# Patient Record
Sex: Male | Born: 1970 | Race: White | Hispanic: No | Marital: Married | Smoking: Never smoker
Health system: Southern US, Community
[De-identification: ages and names within clinical notes are randomized; demographics above are authoritative.]

## PROBLEM LIST (undated history)

## (undated) DIAGNOSIS — K589 Irritable bowel syndrome without diarrhea: Secondary | ICD-10-CM

## (undated) DIAGNOSIS — K2980 Duodenitis without bleeding: Secondary | ICD-10-CM

## (undated) DIAGNOSIS — K219 Gastro-esophageal reflux disease without esophagitis: Secondary | ICD-10-CM

## (undated) DIAGNOSIS — K5792 Diverticulitis of intestine, part unspecified, without perforation or abscess without bleeding: Secondary | ICD-10-CM

## (undated) DIAGNOSIS — K5790 Diverticulosis of intestine, part unspecified, without perforation or abscess without bleeding: Secondary | ICD-10-CM

## (undated) DIAGNOSIS — R7989 Other specified abnormal findings of blood chemistry: Secondary | ICD-10-CM

## (undated) DIAGNOSIS — K222 Esophageal obstruction: Secondary | ICD-10-CM

## (undated) DIAGNOSIS — K449 Diaphragmatic hernia without obstruction or gangrene: Secondary | ICD-10-CM

## (undated) DIAGNOSIS — K769 Liver disease, unspecified: Secondary | ICD-10-CM

## (undated) DIAGNOSIS — F419 Anxiety disorder, unspecified: Secondary | ICD-10-CM

## (undated) DIAGNOSIS — M5136 Other intervertebral disc degeneration, lumbar region: Secondary | ICD-10-CM

## (undated) DIAGNOSIS — F909 Attention-deficit hyperactivity disorder, unspecified type: Secondary | ICD-10-CM

## (undated) DIAGNOSIS — M51369 Other intervertebral disc degeneration, lumbar region without mention of lumbar back pain or lower extremity pain: Secondary | ICD-10-CM

## (undated) HISTORY — PX: HERNIA REPAIR: SHX51

## (undated) HISTORY — DX: Irritable bowel syndrome, unspecified: K58.9

## (undated) HISTORY — DX: Duodenitis without bleeding: K29.80

## (undated) HISTORY — DX: Diverticulosis of intestine, part unspecified, without perforation or abscess without bleeding: K57.90

## (undated) HISTORY — DX: Other intervertebral disc degeneration, lumbar region: M51.36

## (undated) HISTORY — DX: Liver disease, unspecified: K76.9

## (undated) HISTORY — DX: Diaphragmatic hernia without obstruction or gangrene: K44.9

## (undated) HISTORY — DX: Other intervertebral disc degeneration, lumbar region without mention of lumbar back pain or lower extremity pain: M51.369

## (undated) HISTORY — DX: Diverticulitis of intestine, part unspecified, without perforation or abscess without bleeding: K57.92

## (undated) HISTORY — DX: Attention-deficit hyperactivity disorder, unspecified type: F90.9

## (undated) HISTORY — PX: UPPER GASTROINTESTINAL ENDOSCOPY: SHX188

## (undated) HISTORY — DX: Gastro-esophageal reflux disease without esophagitis: K21.9

## (undated) HISTORY — PX: ADENOIDECTOMY: SUR15

## (undated) HISTORY — DX: Anxiety disorder, unspecified: F41.9

## (undated) HISTORY — PX: UMBILICAL HERNIA REPAIR: SHX196

## (undated) HISTORY — PX: TONSILLECTOMY: SUR1361

## (undated) HISTORY — DX: Other specified abnormal findings of blood chemistry: R79.89

---

## 2008-11-17 ENCOUNTER — Emergency Department: Payer: Self-pay | Admitting: Emergency Medicine

## 2011-05-13 ENCOUNTER — Ambulatory Visit (INDEPENDENT_AMBULATORY_CARE_PROVIDER_SITE_OTHER): Payer: Managed Care, Other (non HMO)

## 2011-05-13 DIAGNOSIS — J111 Influenza due to unidentified influenza virus with other respiratory manifestations: Secondary | ICD-10-CM

## 2011-10-14 ENCOUNTER — Emergency Department: Payer: Self-pay | Admitting: Emergency Medicine

## 2011-10-14 LAB — URINALYSIS, COMPLETE
Leukocyte Esterase: NEGATIVE
Ph: 9 (ref 4.5–8.0)
Protein: NEGATIVE
RBC,UR: <1 /HPF (ref 0–5)
Squamous Epithelial: NONE SEEN

## 2011-10-14 LAB — CBC
HCT: 49 % (ref 40.0–52.0)
HGB: 16.6 g/dL (ref 13.0–18.0)
MCHC: 33.8 g/dL (ref 32.0–36.0)
MCV: 88 fL (ref 80–100)
Platelet: 211 10*3/uL (ref 150–440)
RBC: 5.58 10*6/uL (ref 4.40–5.90)
RDW: 13.7 % (ref 11.5–14.5)
WBC: 8.3 10*3/uL (ref 3.8–10.6)

## 2011-10-14 LAB — BASIC METABOLIC PANEL
Calcium, Total: 9.3 mg/dL (ref 8.5–10.1)
Chloride: 105 mmol/L (ref 98–107)
Co2: 24 mmol/L (ref 21–32)
EGFR (African American): >60
Glucose: 106 mg/dL — ABNORMAL HIGH (ref 65–99)
Osmolality: 280 (ref 275–301)

## 2011-10-14 LAB — DRUG SCREEN, URINE
Amphetamines, Ur Screen: NEGATIVE (ref ?–1000)
Benzodiazepine, Ur Scrn: NEGATIVE (ref ?–200)
Cocaine Metabolite,Ur ~~LOC~~: NEGATIVE (ref ?–300)
MDMA (Ecstasy)Ur Screen: NEGATIVE (ref ?–500)
Methadone, Ur Screen: NEGATIVE (ref ?–300)
Opiate, Ur Screen: NEGATIVE (ref ?–300)
Tricyclic, Ur Screen: NEGATIVE (ref ?–1000)

## 2011-10-14 LAB — CK TOTAL AND CKMB (NOT AT ARMC): CK, Total: 307 U/L — ABNORMAL HIGH (ref 35–232)

## 2011-10-19 ENCOUNTER — Ambulatory Visit (INDEPENDENT_AMBULATORY_CARE_PROVIDER_SITE_OTHER): Payer: Managed Care, Other (non HMO) | Admitting: Family Medicine

## 2011-10-19 VITALS — BP 130/84 | HR 62 | Temp 99.0°F | Resp 16 | Ht 68.75 in | Wt 218.0 lb

## 2011-10-19 DIAGNOSIS — K219 Gastro-esophageal reflux disease without esophagitis: Secondary | ICD-10-CM

## 2011-10-19 DIAGNOSIS — S40029A Contusion of unspecified upper arm, initial encounter: Secondary | ICD-10-CM

## 2011-10-19 DIAGNOSIS — R1310 Dysphagia, unspecified: Secondary | ICD-10-CM

## 2011-10-19 DIAGNOSIS — J029 Acute pharyngitis, unspecified: Secondary | ICD-10-CM

## 2011-10-19 LAB — POCT RAPID STREP A (OFFICE): Rapid Strep A Screen: NEGATIVE

## 2011-10-19 MED ORDER — PANTOPRAZOLE SODIUM 40 MG PO TBEC: 40.0000 mg | DELAYED_RELEASE_TABLET | Freq: Every day | ORAL | Status: DC

## 2011-10-19 NOTE — Patient Instructions (Signed)

## 2011-10-19 NOTE — Progress Notes (Signed)
Subjective On Thursday the patient was eating in [place] and caught a piece of meat in his throat. He has had some problems with this in the past. He had an emergency room, but they're able to get down without having to do endoscopy or or any other procedures on him. His father said had his esophagus dilated. The patient does have some reflux history, as well as having a stressful job. Is not taking a lot of medicines for reflux. His throat is sore today. He tried to eat some chicken and at still felt like he didn't want to go down well. Wife is positive for strep today  In the ER they put an IV in his left arm. He today notices a motted bruising distal to the venipuncture site.    Objective Left arm has the needle puncture site in his elbow, and distal to that is a fairly large bruised area about 6 x 8 cm in diameter. TMs are normal. Throat does not appear thematous. Strep screen is taken with some difficulty since he has bad gag reflex. His last ultrasound. Neck is supple without significant nodes. Chest clear.  Assessment: Dysphagia Post foreign body pharyngeal irritation Ecchymosis left forearm secondary to IV  Plan: Check a strep screen. I expect this will be negative despite his wife having recent strep. Will need referral to gastroenterology. I believe he needs an EGD. I think this is more helpful than doing a barium swallow, though they may decide otherwise.  Results for orders placed in visit on 10/19/11  POCT RAPID STREP A (OFFICE)      Component Value Range   Rapid Strep A Screen Negative  Negative

## 2011-10-21 ENCOUNTER — Other Ambulatory Visit: Payer: Self-pay | Admitting: Physician Assistant

## 2011-10-21 DIAGNOSIS — K219 Gastro-esophageal reflux disease without esophagitis: Secondary | ICD-10-CM

## 2011-10-21 DIAGNOSIS — R1310 Dysphagia, unspecified: Secondary | ICD-10-CM

## 2011-10-21 MED ORDER — PANTOPRAZOLE SODIUM 40 MG PO TBEC: 40.0000 mg | DELAYED_RELEASE_TABLET | Freq: Every day | ORAL | Status: DC

## 2011-10-22 ENCOUNTER — Encounter: Payer: Self-pay | Admitting: Gastroenterology

## 2011-10-24 ENCOUNTER — Ambulatory Visit: Payer: Self-pay | Admitting: Gastroenterology

## 2011-10-24 ENCOUNTER — Telehealth: Payer: Self-pay | Admitting: Internal Medicine

## 2011-10-24 NOTE — Telephone Encounter (Signed)
Spoke with patient and scheduled him for OV on 10/31/11 ar 2:00 PM.

## 2011-10-27 ENCOUNTER — Encounter: Payer: Self-pay | Admitting: *Deleted

## 2011-10-30 ENCOUNTER — Ambulatory Visit: Payer: Managed Care, Other (non HMO)

## 2011-10-30 ENCOUNTER — Ambulatory Visit (INDEPENDENT_AMBULATORY_CARE_PROVIDER_SITE_OTHER): Payer: Managed Care, Other (non HMO) | Admitting: Internal Medicine

## 2011-10-30 VITALS — BP 132/85 | HR 75 | Temp 98.9°F | Resp 18 | Ht 69.0 in | Wt 211.0 lb

## 2011-10-30 DIAGNOSIS — F411 Generalized anxiety disorder: Secondary | ICD-10-CM

## 2011-10-30 DIAGNOSIS — R634 Abnormal weight loss: Secondary | ICD-10-CM

## 2011-10-30 DIAGNOSIS — F909 Attention-deficit hyperactivity disorder, unspecified type: Secondary | ICD-10-CM

## 2011-10-30 DIAGNOSIS — F419 Anxiety disorder, unspecified: Secondary | ICD-10-CM

## 2011-10-30 DIAGNOSIS — R110 Nausea: Secondary | ICD-10-CM

## 2011-10-30 DIAGNOSIS — R1310 Dysphagia, unspecified: Secondary | ICD-10-CM

## 2011-10-30 DIAGNOSIS — R630 Anorexia: Secondary | ICD-10-CM

## 2011-10-30 DIAGNOSIS — M542 Cervicalgia: Secondary | ICD-10-CM

## 2011-10-30 DIAGNOSIS — R002 Palpitations: Secondary | ICD-10-CM

## 2011-10-30 MED ORDER — ALPRAZOLAM 0.5 MG PO TBDP: 0.5000 mg | ORAL_TABLET | Freq: Two times a day (BID) | ORAL | Status: AC | PRN

## 2011-10-30 NOTE — Progress Notes (Signed)
  Subjective:    Patient ID: Jesse Steele, male    DOB: [birth year], 41 y.o.   MRN: [id number]  HPI 41 y/o with a 2 mo hx of dysphagia, wt loss, nausea , food catching in throat, mid epig pain. He has ADHD on Daytrana, gerd on pantoprazole, and is scheduled to see GI Dr. Juanda Chance. Father has an esophageal stricture. Had er visit to , lab work and EKG were nl by his report. No alcohol, no smoke    Review of Systems ADHD    Objective:   Physical Exam  Constitutional: He is oriented to person, place, and time. He appears well-developed and well-nourished.  HENT:  Right Ear: External ear normal.  Left Ear: External ear normal.  Nose: Nose normal.  Eyes: EOM are normal. Pupils are equal, round, and reactive to light. No scleral icterus.  Neck: Normal range of motion. No thyromegaly present.  Cardiovascular: Normal rate, regular rhythm and normal heart sounds.   Pulmonary/Chest: Effort normal and breath sounds normal.  Abdominal: Soft. Bowel sounds are normal. He exhibits no distension and no mass. There is tenderness. There is no rebound and no guarding.  Lymphadenopathy:    He has no cervical adenopathy.  Neurological: He is alert and oriented to person, place, and time.  Skin: Skin is warm and dry.  Psychiatric: Judgment and thought content normal. His mood appears anxious. His speech is rapid and/or pressured. He is is hyperactive. Cognition and memory are normal.   [facility] reading (PRIMARY) by  Dr.Debrina Kizer. xr are neg         Assessment & Plan:  ADHD maybe med to strong- discuss with Dr. Ella Jubilee Dysphagia To GI Anxiety/ADHD needs more attention, trial alprazolam

## 2011-10-30 NOTE — Patient Instructions (Addendum)
Dysphagia Swallowing problems (dysphagia) occur when solids and liquids seem to stick in your throat on the way down to your stomach, or the food takes longer to get to the stomach. Other symptoms (problems) include regurgitating (burping) up food, noises coming from the throat, chest discomfort with swallowing, and a feeling of fullness in the throat when swallowing. When blockage in the throat is complete it may be associated with drooling. CAUSES There are many causes of swallowing difficulties and the following is generalized information regarding a number of reasons for this problem. Problems with swallowing may occur because of problems with the muscles. The food cannot be propelled in the usual manner into the stomach. There may be ulcers, scar tissueor inflammation (soreness) in the esophagus (the food tube from the mouth to the stomach) which blocks food from passing normally into the stomach. Causes of inflammation include acid reflux from the stomach into the esophagus. Inflammation can also be caused by the herpes simplex virus, Candida (yeast), radiation (as with treatment of cancer), or inflammation from medications not taken with adequate fluids to wash them down into the stomach. There may be nerve problems so signals cannot be sent adequately telling the muscles of the esophagus to contract and move the food along. Achalasia is a rare disorder of the esophagus in which muscular contractions of the esophagus are uncoordinated. Globus hystericus is a relatively common problem in young females in which there is a sense of an obstruction or difficulty in swallowing, but in which no abnormalities can be found. This problem usually improves over time with reassurance and testing to rule out other causes. DIAGNOSIS A number of tests will help your caregiver know what is the cause of your swallowing problems. These tests may include a barium swallow in which x-rays are taken while you are drinking a  liquid that outlines the lining of the esophagus on x-ray. If the stomach and small bowel are also studied in this manner it is called an upper gastrointestinal exam (UGI). Endoscopy may be done in which your caregiver examines your throat, esophagus, stomach and small bowel with an instrument like a small flexible telescope. Motility studies which measure the effectiveness and coordination of the muscular contractions of the esophagus may also be done. TREATMENT The treatment of swallowing problems are many, varying from medications to surgical treatment. The treatment varies with the type of problem found. Your caregiver will discuss your results and treatment with you. If swallowing problems are severe the long term problems which may occur include: malnutrition, pneumonia (from food going into the breathing tubes called trachea and bronchi), and an increase in tumors (lumps) of the esophagus. SEEK IMMEDIATE MEDICAL CARE IF:  Food or other object becomes lodged in your throat or esophagus and won't move.  Document Released: 04/25/2000 Document Revised: 04/17/2011 Document Reviewed: 12/15/2007 [facility] Patient Information 2012 [place], [place].Anxiety and Panic Attacks Your caregiver has informed you that you are having an anxiety or panic attack. There may be many forms of this. Most of the time these attacks come suddenly and without warning. They come at any time of day, including periods of sleep, and at any time of life. They may be strong and unexplained. Although panic attacks are very scary, they are physically harmless. Sometimes the cause of your anxiety is not known. Anxiety is a protective mechanism of the body in its fight or flight mechanism. Most of these perceived danger situations are actually nonphysical situations (such as anxiety over losing a job).  CAUSES  The causes of an anxiety or panic attack are many. Panic attacks may occur in otherwise healthy people given a certain set of  circumstances. There may be a genetic cause for panic attacks. Some medications may also have anxiety as a side effect. SYMPTOMS  Some of the most common feelings are:  Intense terror.   Dizziness, feeling faint.   Hot and cold flashes.   Fear of going crazy.   Feelings that nothing is real.   Sweating.   Shaking.   Chest pain or a fast heartbeat (palpitations).   Smothering, choking sensations.   Feelings of impending doom and that death is near.   Tingling of extremities, this may be from over-breathing.   Altered reality (derealization).   Being detached from yourself (depersonalization).  Several symptoms can be present to make up anxiety or panic attacks. DIAGNOSIS  The evaluation by your caregiver will depend on the type of symptoms you are experiencing. The diagnosis of anxiety or panic attack is made when no physical illness can be determined to be a cause of the symptoms. TREATMENT  Treatment to prevent anxiety and panic attacks may include:  Avoidance of circumstances that cause anxiety.   Reassurance and relaxation.   Regular exercise.   Relaxation therapies, such as yoga.   Psychotherapy with a psychiatrist or therapist.   Avoidance of caffeine, alcohol and illegal drugs.   Prescribed medication.  SEEK IMMEDIATE MEDICAL CARE IF:   You experience panic attack symptoms that are different than your usual symptoms.   You have any worsening or concerning symptoms.  Document Released: 04/28/2005 Document Revised: 04/17/2011 Document Reviewed: 08/30/2009 [facility] Patient Information 2012 [place], [place].

## 2011-10-31 ENCOUNTER — Ambulatory Visit (INDEPENDENT_AMBULATORY_CARE_PROVIDER_SITE_OTHER): Payer: Managed Care, Other (non HMO) | Admitting: Internal Medicine

## 2011-10-31 ENCOUNTER — Encounter: Payer: Self-pay | Admitting: Internal Medicine

## 2011-10-31 VITALS — BP 132/68 | HR 76 | Ht 69.0 in | Wt 216.0 lb

## 2011-10-31 DIAGNOSIS — K219 Gastro-esophageal reflux disease without esophagitis: Secondary | ICD-10-CM

## 2011-10-31 DIAGNOSIS — R1319 Other dysphagia: Secondary | ICD-10-CM

## 2011-10-31 NOTE — Patient Instructions (Addendum)
You have been scheduled for an endoscopy with dilation. Please follow written instructions given to you at your visit today. CC: Dr Mila Homer

## 2011-10-31 NOTE — Progress Notes (Signed)
Jesse Steele [birth year] MRN [id number]     History of Present Illness:  This is a 41 year old white male with solid food dysphagia and several episodes of food getting caught in his esophagus while in a restaurant. The dysphagia is specifically for meats. He denies dysphagia to liquids and he denies odynophagia. He has frequent belching but denies heartburn. He has recently lost about 7 pounds, but not intentionally. He was given Protonix 40 mg daily by Dr Perrin Maltese but has not actually started it yet. We have been seeing his father who has a similar problem of benign distal esophageal stricture. Patient denies drinking excessive alcohol but he does take Advil or aspirin for musculoskeletal pain. He does not smoke.   Past Medical History  Diagnosis Date  . GERD (gastroesophageal reflux disease)   . ADHD (attention deficit hyperactivity disorder)    Past Surgical History  Procedure Date  . Hernia repair     reports that he has never smoked. He has never used smokeless tobacco. He reports that he drinks alcohol. He reports that he does not use illicit drugs. family history includes Prostate cancer in his father.  There is no history of Colon cancer. Allergies  Allergen Reactions  . Penicillins Nausea Only  . Sulfa Antibiotics Nausea Only        Review of Systems: Chest pain which has been evaluated and cardiac causes were ruled out  The remainder of the 10 point ROS is negative except as outlined in H&P   Physical Exam: General appearance  Well developed, in no distress. Eyes- non icteric. HEENT nontraumatic, normocephalic. Mouth no lesions, tongue papillated, no cheilosis. Neck supple without adenopathy, thyroid not enlarged, no carotid bruits, no JVD. Lungs Clear to auscultation bilaterally. Cor normal S1, normal S2, regular rhythm, no murmur,  quiet precordium. Abdomen: Soft nontender with prominent xiphoid process. Normoactive bowel sounds. No tenderness. No palpable  mass. Rectal: Not done. Extremities no pedal edema. Skin no lesions. Neurological alert and oriented x 3. Psychological normal mood and affect.  Assessment and Plan:  Problem #1 Solid food dysphagia consistent with an esophageal stricture most likely benign due to chronic gastroesophageal reflux. We need to rule out Barrett's esophagus or a malignant stricture. We will proceed with an upper endoscopy and dilatation. I encouraged him to start on Protonix 40 mg daily and antireflux measures. We have discussed long-term acid suppression to prevent a recurrence of the stricture. He will also minimize anti-inflammatory agents.   10/31/2011 Lina Sar

## 2011-11-03 ENCOUNTER — Ambulatory Visit (HOSPITAL_COMMUNITY)
Admission: RE | Admit: 2011-11-03 | Discharge: 2011-11-03 | Disposition: A | Discharge status: Home / Self Care | Payer: Managed Care, Other (non HMO) | Source: Ambulatory Visit | Attending: Internal Medicine | Admitting: Internal Medicine

## 2011-11-03 ENCOUNTER — Encounter (HOSPITAL_COMMUNITY)
Admission: RE | Disposition: A | Discharge status: Home / Self Care | Payer: Self-pay | Source: Ambulatory Visit | Attending: Internal Medicine

## 2011-11-03 ENCOUNTER — Encounter (HOSPITAL_COMMUNITY): Payer: Self-pay | Admitting: *Deleted

## 2011-11-03 ENCOUNTER — Telehealth: Payer: Self-pay | Admitting: Internal Medicine

## 2011-11-03 DIAGNOSIS — R1319 Other dysphagia: Secondary | ICD-10-CM

## 2011-11-03 DIAGNOSIS — K2980 Duodenitis without bleeding: Secondary | ICD-10-CM | POA: Insufficient documentation

## 2011-11-03 DIAGNOSIS — K219 Gastro-esophageal reflux disease without esophagitis: Secondary | ICD-10-CM | POA: Insufficient documentation

## 2011-11-03 DIAGNOSIS — K222 Esophageal obstruction: Secondary | ICD-10-CM

## 2011-11-03 DIAGNOSIS — F909 Attention-deficit hyperactivity disorder, unspecified type: Secondary | ICD-10-CM | POA: Insufficient documentation

## 2011-11-03 HISTORY — PX: ESOPHAGOGASTRODUODENOSCOPY: SHX5428

## 2011-11-03 HISTORY — DX: Esophageal obstruction: K22.2

## 2011-11-03 HISTORY — PX: SAVORY DILATION: SHX5439

## 2011-11-03 SURGERY — EGD (ESOPHAGOGASTRODUODENOSCOPY)
Anesthesia: Moderate Sedation

## 2011-11-03 MED ORDER — FENTANYL CITRATE 0.05 MG/ML IJ SOLN
INTRAMUSCULAR | Status: AC
  Filled 2011-11-03: qty 4

## 2011-11-03 MED ORDER — BUTAMBEN-TETRACAINE-BENZOCAINE 2-2-14 % EX AERO
INHALATION_SPRAY | CUTANEOUS | Status: DC | PRN
  Administered 2011-11-03: 2 via TOPICAL

## 2011-11-03 MED ORDER — SODIUM CHLORIDE 0.9 % IV SOLN
Freq: Once | INTRAVENOUS | Status: AC
  Administered 2011-11-03: 500 mL via INTRAVENOUS

## 2011-11-03 MED ORDER — OMEPRAZOLE 40 MG PO CPDR: 40.0000 mg | DELAYED_RELEASE_CAPSULE | Freq: Every day | ORAL | Status: DC

## 2011-11-03 MED ORDER — FENTANYL NICU IV SYRINGE 50 MCG/ML
INJECTION | INTRAMUSCULAR | Status: DC | PRN
  Administered 2011-11-03 (×3): 25 ug via INTRAVENOUS

## 2011-11-03 MED ORDER — DIPHENHYDRAMINE HCL 50 MG/ML IJ SOLN
INTRAMUSCULAR | Status: AC
  Filled 2011-11-03: qty 1

## 2011-11-03 MED ORDER — MIDAZOLAM HCL 10 MG/2ML IJ SOLN
INTRAMUSCULAR | Status: DC | PRN
  Administered 2011-11-03 (×3): 2.5 mg via INTRAVENOUS

## 2011-11-03 MED ORDER — MIDAZOLAM HCL 10 MG/2ML IJ SOLN
INTRAMUSCULAR | Status: AC
  Filled 2011-11-03: qty 4

## 2011-11-03 NOTE — Telephone Encounter (Signed)
Prior auth form filled out and faxed back to [employer].

## 2011-11-03 NOTE — Interval H&P Note (Signed)
History and Physical Interval Note:  11/03/2011 8:02 AM  Jesse Steele  has presented today for surgery, with the diagnosis of dysphagia  The various methods of treatment have been discussed with the patient and family. After consideration of risks, benefits and other options for treatment, the patient has consented to  Procedure(s) (LRB): ESOPHAGOGASTRODUODENOSCOPY (EGD) (N/A) SAVORY DILATION (N/A) as a surgical intervention .  The patient's history has been reviewed, patient examined, no change in status, stable for surgery.  I have reviewed the patients' chart and labs.  Questions were answered to the patient's satisfaction.     Lina Sar

## 2011-11-03 NOTE — Telephone Encounter (Signed)
Reviewed post procedure instructions with patient.

## 2011-11-03 NOTE — Discharge Instructions (Addendum)
Elevate the head of bed at night about 4 inches to reduce the reflux, take prilosec 40 mg daily for acid supression Avoid excess Advil/Aleve   Esophageal Dilatation The esophagus is the long, narrow tube which carries food and liquid from the mouth to the stomach. Esophageal dilatation is the technique used to stretch a blocked or narrowed portion of the esophagus. This procedure is used when a part of the esophagus has become so narrow that it becomes difficult, painful or even impossible to swallow. This is generally an uncomplicated form of treatment. When this is not successful, chest surgery may be required. This is a much more extensive form of treatment with a longer recovery time. CAUSES  Some of the more common causes of blockage or strictures of the esophagus are:  Narrowing from longstanding inflammation (soreness and redness) of the lower esophagus. This comes from the constant exposure of the lower esophagus to the acid which bubbles up from the stomach. Over time this causes scarring and narrowing of the lower esophagus.   Hiatal hernia in which a small part of the stomach bulges (herniates) up through the diaphragm. This can cause a gradual narrowing of the end of the esophagus.   Schatzki's Ring is a narrow ring of benign (non-cancerous) fibrous tissue which constricts the lower esophagus. The reason for this is not known.   Scleroderma is a connective tissue disorder that affects the esophagus and makes swallowing difficult.   Achalasia is an absence of nerves to the lower esophagus and to the esophageal sphincter. This is the circular muscle between the stomach and esophagus that relaxes to allow food into the stomach. After swallowing, it contracts to keep food in the stomach. This absence of nerves may be congenital (present since birth). This can cause irregular spasms of the lower esophageal muscle. This spasm does not open up to allow food and fluid through. The result is a  persistent blockage with subsequent slow trickling of the esophageal contents into the stomach.   Strictures may develop from swallowing materials which damage the esophagus. Some examples are strong acids or alkalis such as lye.   Growths such as benign (non-cancerous) and malignant (cancerous) tumors can block the esophagus.   Heredity (present since birth) causes.  DIAGNOSIS  Your caregiver often suspects this problem by taking a medical history. They will also do a physical exam. They can then prove their suspicions using X-rays and endoscopy. Endoscopy is an exam in which a tube like a small flexible telescope is used to look at your esophagus.  TREATMENT There are different stretching (dilating) techniques which can be used. Simple bougie dilatation may be done in the office. This usually takes only a couple minutes. A numbing (anesthetic) spray of the throat is used. Endoscopy, when done, is done in an endoscopy suite, under mild sedation. When fluoroscopy is used, the procedure is performed in X-ray. Other techniques require a little longer time. Recovery is usually quick. There is no waiting time to begin eating and drinking to test success of the treatment. Following are some of the methods used. Narrowing of the esophagus is treated by making it bigger. Commonly this is a mechanical problem which can be treated with stretching. This can be done in different ways. Your caregiver will discuss these with you. Some of the means used are:  A series of graduated (increasing thickness) flexible dilators can be used. These are weighted tubes passed through the esophagus into the stomach. The tubes used become  progressively larger until the desired stretched size is reached. Graduated dilators are a simple and quick way of opening the esophagus. No visualization is required.   Another method is the use of endoscopy to place a flexible wire across the stricture. The endoscope is removed and the wire  left in place. A dilator with a hole through it from end to end is guided down the esophagus and across the stricture. One or more of these dilators are passed over the wire. At the end of the exam, the wire is removed. This type of treatment may be performed in the X-ray department under fluoroscopy. An advantage of this procedure is the examiner is visualizing the end opening in the esophagus.   Stretching of the esophagus may be done using balloons. Deflated balloons are placed through the endoscope and across the stricture. This type of balloon dilatation is often done at the time of endoscopy or fluoroscopy. Flexible endoscopy allows the examiner to directly view the stricture. A balloon is inserted in the deflated form into the area of narrowing. It is then inflated with air to a certain pressure that is pre-set for a given circumference. When inflated, it becomes sausage shaped, stretched, and makes the stricture larger.   Achalasia requires a longer larger balloon-type dilator. This is frequently done under X-ray control. In this situation, the spastic muscle fibers in the lower esophagus are stretched.  All of the above procedures make the passage of food and water into the stomach easier. They also make it easier for stomach contents to reflux back into the esophagus. Special medications may be used following the procedure to help prevent further stricturing. Proton-pump inhibitor medications are good at decreasing the amount of acid in the stomach juice. When stomach juice refluxes into the esophagus, the juice is no longer as acidic and is less likely to burn or scar the esophagus. RISKS AND COMPLICATIONS Esophageal dilatation is usually performed effectively and without problems. Some complications that can occur are:  A small amount of bleeding almost always happens where the stretching takes place. If this is too excessive it may require more aggressive treatment.   An uncommon complication  is perforation (making a hole) of the esophagus. The esophagus is thin. It is easy to make a hole in it. If this happens, an operation may be necessary to repair this.   A small, undetected perforation could lead to an infection in the chest. This can be very serious.  HOME CARE INSTRUCTIONS   If you received sedation for your procedure, do not drive, make important decisions, or perform any activities requiring your full coordination. Do not drink alcohol, take sedatives, or use any mind altering chemicals unless instructed by your caregiver.   You may use throat lozenges or warm salt water gargles if you have throat discomfort   You can begin eating and drinking normally on return home unless instructed otherwise. Do not purposely try to force large chunks of food down to test the benefits of your procedure.   Mild discomfort can be eased with sips of ice water.   Medications for discomfort may or may not be needed.  SEEK IMMEDIATE MEDICAL CARE IF:   You begin vomiting up blood.   You develop black tarry stools   You develop chills or an unexplained temperature of over 101 F (38.3 C)   You develop chest or abdominal pain.   You develop shortness of breath or feel lightheaded or faint.   Your swallowing  is becoming more painful, difficult, or you are unable to swallow.  MAKE SURE YOU:   Understand these instructions.   Will watch your condition.   Will get help right away if you are not doing well or get worse.  Document Released: 06/19/2005 Document Revised: 04/17/2011 Document Reviewed: 08/06/2005 [facility] Patient Information 2012 [place], [place].

## 2011-11-03 NOTE — H&P (View-Only) (Signed)
Jesse Steele 10/09/1970 MRN [id number]     History of Present Illness:  This is a 40-year-old white male with solid food dysphagia and several episodes of food getting caught in his esophagus while in a restaurant. The dysphagia is specifically for meats. He denies dysphagia to liquids and he denies odynophagia. He has frequent belching but denies heartburn. He has recently lost about 7 pounds, but not intentionally. He was given Protonix 40 mg daily by Dr Guest but has not actually started it yet. We have been seeing his father who has a similar problem of benign distal esophageal stricture. Patient denies drinking excessive alcohol but he does take Advil or aspirin for musculoskeletal pain. He does not smoke.   Past Medical History  Diagnosis Date  . GERD (gastroesophageal reflux disease)   . ADHD (attention deficit hyperactivity disorder)    Past Surgical History  Procedure Date  . Hernia repair     reports that he has never smoked. He has never used smokeless tobacco. He reports that he drinks alcohol. He reports that he does not use illicit drugs. family history includes Prostate cancer in his father.  There is no history of Colon cancer. Allergies  Allergen Reactions  . Penicillins Nausea Only  . Sulfa Antibiotics Nausea Only        Review of Systems: Chest pain which has been evaluated and cardiac causes were ruled out  The remainder of the 10 point ROS is negative except as outlined in H&P   Physical Exam: General appearance  Well developed, in no distress. Eyes- non icteric. HEENT nontraumatic, normocephalic. Mouth no lesions, tongue papillated, no cheilosis. Neck supple without adenopathy, thyroid not enlarged, no carotid bruits, no JVD. Lungs Clear to auscultation bilaterally. Cor normal S1, normal S2, regular rhythm, no murmur,  quiet precordium. Abdomen: Soft nontender with prominent xiphoid process. Normoactive bowel sounds. No tenderness. No palpable  mass. Rectal: Not done. Extremities no pedal edema. Skin no lesions. Neurological alert and oriented x 3. Psychological normal mood and affect.  Assessment and Plan:  Problem #1 Solid food dysphagia consistent with an esophageal stricture most likely benign due to chronic gastroesophageal reflux. We need to rule out Barrett's esophagus or a malignant stricture. We will proceed with an upper endoscopy and dilatation. I encouraged him to start on Protonix 40 mg daily and antireflux measures. We have discussed long-term acid suppression to prevent a recurrence of the stricture. He will also minimize anti-inflammatory agents.   10/31/2011 Jesse Steele  

## 2011-11-03 NOTE — Op Note (Signed)
[facility] [address] [place], [place]  [address]  ENDOSCOPY PROCEDURE REPORT  PATIENT:  Jesse Steele, Jesse Steele  MR#:  [id number] BIRTHDATE:  Jun 08, 1970, 40 yrs. old  GENDER:  male  ENDOSCOPIST:  Hedwig Morton. Juanda Chance, MD Referred by:  Robert Bellow, M.D.  PROCEDURE DATE:  11/03/2011 PROCEDURE:  EGD with biopsy, [address], EGD with dilatation over guidewire ASA CLASS:  Class I INDICATIONS:  dysphagia, GERD solid food dysphagia  MEDICATIONS:   These medications were titrated to patient response per physician's verbal order, Versed 7.5 mg, Fentanyl 75 mcg TOPICAL ANESTHETIC:  Cetacaine Spray  DESCRIPTION OF PROCEDURE:   After the risks benefits and alternatives of the procedure were thoroughly explained, informed consent was obtained.  The Pentax Gastroscope [id number] endoscope was introduced through the mouth and advanced to the second portion of the duodenum, without limitations.  The instrument was slowly withdrawn as the mucosa was fully examined. <<PROCEDUREIMAGES>>  A stricture was found in the distal esophagus. mild fibrous ring at g-e juncyion, easily travrtsed with endoscope, no acute esophagitis ( he has been on Protonix) With standard forceps, a biopsy was obtained and sent to pathology (see image2 and image1). Savary dilation over a guidewire 15 and 16 mm dilators passed without difficulty  Duodenitis was found. patchy erythema in the bulb With standard forceps, a biopsy was obtained and sent to pathology (see image5, image3, and image4). r/o peptic duodenitis Otherwise the examination was normal (see image7 and image6). Retroflexed views revealed mass.    The scope was then withdrawn from the patient and the procedure completed.  COMPLICATIONS:  None  ENDOSCOPIC IMPRESSION: 1) Stricture in the distal esophagus 2) Duodenitis 3) Otherwise normal examination s/p passage of 15 and 16 mm dilators RECOMMENDATIONS: 1) Await biopsy results 2) Anti-reflux regimen to be  follow continue Protonix 40 mg qd  REPEAT EXAM:  In 0 year(s) for.  redilate as necessary  ______________________________ Hedwig Morton. Juanda Chance, MD  CC:  n. eSIGNED:   Hedwig Morton. Almeda Ezra at 11/03/2011 08:51 AM  Ralene Cork, [phone]

## 2011-11-04 ENCOUNTER — Encounter (HOSPITAL_COMMUNITY): Payer: Self-pay | Admitting: Internal Medicine

## 2011-11-04 ENCOUNTER — Encounter: Payer: Self-pay | Admitting: Internal Medicine

## 2011-11-06 ENCOUNTER — Telehealth: Payer: Self-pay | Admitting: Internal Medicine

## 2011-11-06 NOTE — Telephone Encounter (Signed)
Left message for patient to call back  

## 2011-11-07 NOTE — Telephone Encounter (Signed)
Spoke with patient and gave him results

## 2011-12-15 ENCOUNTER — Ambulatory Visit: Payer: Self-pay | Admitting: Gastroenterology

## 2012-05-06 ENCOUNTER — Ambulatory Visit (INDEPENDENT_AMBULATORY_CARE_PROVIDER_SITE_OTHER): Payer: Managed Care, Other (non HMO) | Admitting: Emergency Medicine

## 2012-05-06 VITALS — BP 138/86 | HR 78 | Temp 98.4°F | Resp 18 | Ht 69.25 in | Wt 214.2 lb

## 2012-05-06 DIAGNOSIS — R110 Nausea: Secondary | ICD-10-CM

## 2012-05-06 DIAGNOSIS — R109 Unspecified abdominal pain: Secondary | ICD-10-CM

## 2012-05-06 DIAGNOSIS — R1310 Dysphagia, unspecified: Secondary | ICD-10-CM

## 2012-05-06 LAB — POCT CBC
Granulocyte percent: 59 %G (ref 37–80)
HCT, POC: 52.3 % (ref 43.5–53.7)
MCH, POC: 29.2 pg (ref 27–31.2)
MCV: 89.7 fL (ref 80–97)
POC LYMPH PERCENT: 32.8 %L (ref 10–50)
RDW, POC: 13.4 %
WBC: 7.9 10*3/uL (ref 4.6–10.2)

## 2012-05-06 LAB — COMPREHENSIVE METABOLIC PANEL
Albumin: 5 g/dL (ref 3.5–5.2)
Alkaline Phosphatase: 53 U/L (ref 39–117)
BUN: 11 mg/dL (ref 6–23)
Calcium: 10.2 mg/dL (ref 8.4–10.5)
Chloride: 103 mEq/L (ref 96–112)
Glucose, Bld: 94 mg/dL (ref 70–99)
Potassium: 3.7 mEq/L (ref 3.5–5.3)

## 2012-05-06 LAB — AMYLASE: Amylase: 50 U/L (ref 0–105)

## 2012-05-06 LAB — LIPASE: Lipase: 15 U/L (ref 0–75)

## 2012-05-06 MED ORDER — RANITIDINE HCL 150 MG PO TABS: 150.0000 mg | ORAL_TABLET | Freq: Two times a day (BID) | ORAL | Status: DC

## 2012-05-06 MED ORDER — OMEPRAZOLE 40 MG PO CPDR: 40.0000 mg | DELAYED_RELEASE_CAPSULE | Freq: Every day | ORAL | Status: DC

## 2012-05-06 NOTE — Progress Notes (Signed)
  Subjective:    Patient ID: Jesse Steele, male    DOB: [birth year], 41 y.o.   MRN: [id number]  HPI patient presents with nausea and inability to eat. He's had a sensation of a closing in his esophagus. He feels a tight sensation in his throat. He has been under a lot of stress. He lost his mother in the summer and his grandmother 2 weeks ago. He has not had any actual vomiting. He has not had weight loss. He's not had any change in bowel habit    Review of Systems     Objective:   Physical Exam patient is alert and cooperative does not appear in distress. His neck is supple. His chest is clear to both auscultation and percussion. His cardiac exam reveals regular rate without murmurs. Results for orders placed in visit on 05/06/12  POCT CBC      Component Value Range   WBC 7.9  4.6 - 10.2 K/uL   Lymph, poc 2.6  0.6 - 3.4   POC LYMPH PERCENT 32.8  10 - 50 %L   MID (cbc) 0.6  0 - 0.9   POC MID % 8.2  0 - 12 %M   POC Granulocyte 4.7  2 - 6.9   Granulocyte percent 59.0  37 - 80 %G   RBC 5.83  4.69 - 6.13 M/uL   Hemoglobin 17.0  14.1 - 18.1 g/dL   HCT, POC 86.5  78.4 - 53.7 %   MCV 89.7  80 - 97 fL   MCH, POC 29.2  27 - 31.2 pg   MCHC 32.5  31.8 - 35.4 g/dL   RDW, POC 69.6     Platelet Count, POC 254  142 - 424 K/uL   MPV 8.7  0 - 99.8 fL         Assessment & Plan:  Patient high risk for gallbladder disease and does have a history of dysphasia. We'll proceed with CBC amylase lipase and Cmet. We'll have him take Prilosec with Zantac and get an ultrasound of the abdomen. He may need to have either endoscopy or barium swallow. Patient did have an endoscopy done in the summer. He did have a stricture at that time which required dilatation. If his gallbladder studies and labs or normal will refer back to Dr. Juanda Chance for her  further evaluation.

## 2012-05-07 ENCOUNTER — Ambulatory Visit
Admission: RE | Admit: 2012-05-07 | Discharge: 2012-05-07 | Disposition: A | Discharge status: Home / Self Care | Payer: Managed Care, Other (non HMO) | Source: Ambulatory Visit | Attending: Emergency Medicine | Admitting: Emergency Medicine

## 2012-05-07 ENCOUNTER — Telehealth: Payer: Self-pay | Admitting: Emergency Medicine

## 2012-05-07 DIAGNOSIS — R110 Nausea: Secondary | ICD-10-CM

## 2012-05-07 DIAGNOSIS — R109 Unspecified abdominal pain: Secondary | ICD-10-CM

## 2012-05-07 DIAGNOSIS — R935 Abnormal findings on diagnostic imaging of other abdominal regions, including retroperitoneum: Secondary | ICD-10-CM

## 2012-05-07 NOTE — Telephone Encounter (Signed)
Spoke to patient to advise. He is feeling better with his abdomen wants [place] to order the scans. Order placed

## 2012-05-07 NOTE — Telephone Encounter (Signed)
Please be sure you saw my note regarding notifying the patient about her abnormal ultrasound. She needs to let us if she wants to see the  GI specialist  Or  have her MRI .

## 2012-05-07 NOTE — Telephone Encounter (Signed)
Please call patient and let him know they did not see signs of gallbladder disease. There were 2 abnormal area seen in the liver. The radiologist recommended doing an MRI of the abdomen with contrast to evaluate these areas. We can go ahead and order these x-ray studies or I can make him an appointment to see the gastroenterology specialist and decide what further testing is necessary. Please let me know which he would prefer.  Left message for patient to call me back.

## 2012-05-08 ENCOUNTER — Telehealth: Payer: Self-pay

## 2012-05-08 NOTE — Telephone Encounter (Signed)
Patient is returning our phone call regarding result for stomach ulcer.   Best#: [phone]

## 2012-05-08 NOTE — Telephone Encounter (Signed)
Patient does not have stomach ulcer, he is very upset about having the MRI./ anxious will you speak to me about this, he has had 3 phone conversations, with me, I may need you to call her.

## 2012-05-09 ENCOUNTER — Ambulatory Visit (INDEPENDENT_AMBULATORY_CARE_PROVIDER_SITE_OTHER): Payer: Managed Care, Other (non HMO) | Admitting: Emergency Medicine

## 2012-05-09 VITALS — BP 147/91 | HR 99 | Temp 98.5°F | Resp 18 | Ht 70.5 in | Wt 213.0 lb

## 2012-05-09 DIAGNOSIS — F419 Anxiety disorder, unspecified: Secondary | ICD-10-CM

## 2012-05-09 DIAGNOSIS — Z23 Encounter for immunization: Secondary | ICD-10-CM

## 2012-05-09 DIAGNOSIS — F411 Generalized anxiety disorder: Secondary | ICD-10-CM

## 2012-05-09 DIAGNOSIS — K219 Gastro-esophageal reflux disease without esophagitis: Secondary | ICD-10-CM

## 2012-05-09 DIAGNOSIS — R05 Cough: Secondary | ICD-10-CM

## 2012-05-09 MED ORDER — SUCRALFATE 1 G PO TABS: ORAL_TABLET | ORAL | Status: DC

## 2012-05-09 MED ORDER — LORAZEPAM 1 MG PO TABS: 1.0000 mg | ORAL_TABLET | Freq: Three times a day (TID) | ORAL | Status: DC | PRN

## 2012-05-09 MED ORDER — PAROXETINE HCL 20 MG PO TABS: 20.0000 mg | ORAL_TABLET | ORAL | Status: DC

## 2012-05-09 MED ORDER — DEXLANSOPRAZOLE 30 MG PO CPDR: 30.0000 mg | DELAYED_RELEASE_CAPSULE | Freq: Every day | ORAL | Status: DC

## 2012-05-09 NOTE — Progress Notes (Signed)
Reviewed and agree.

## 2012-05-09 NOTE — Progress Notes (Signed)
[facility] [address], [place] [address] [phone]  Date:  05/09/2012   Name:  Xavion Muscat   DOB:  23-Apr-1971   MRN:  [id number]  PCP:  Tally Due, MD    Chief Complaint: Diarrhea   History of Present Illness:  Kendrick Haapala is a 41 y.o. very pleasant male patient who presents with the following:  Numerous stressors.  Lost grandmother 2 months ago and now is being evaluated for liver masses by MRI that were found on [place].  Has esophageal stricture under treatment.  Has breakthrough reflux despite multiple H2 blockers.  Some nausea.  Is very stressed and not able to relax and has difficulty with sleep.  His wife is fearful of "the worst" and making him anxious.  Has muscle aches and chills.  Nonproductive cough with no wheezing or shortness of breath.  Myalgias.  Ill children at home.  No sore throat or nasal drainage  Patient Active Problem List  Diagnosis  . ADHD (attention deficit hyperactivity disorder)  . Stricture and stenosis of esophagus  . Other dysphagia    Past Medical History  Diagnosis Date  . GERD (gastroesophageal reflux disease)   . ADHD (attention deficit hyperactivity disorder)   . Esophageal stricture     Past Surgical History  Procedure Date  . Hernia repair     umblical hernia repair  . Tonsillectomy   . Adenoidectomy   . Esophagogastroduodenoscopy 11/03/2011    Procedure: ESOPHAGOGASTRODUODENOSCOPY (EGD);  Surgeon: Hart Carwin, MD;  Location: [facility];  Service: Endoscopy;  Laterality: N/A;  . Savory dilation 11/03/2011    Procedure: SAVORY DILATION;  Surgeon: Hart Carwin, MD;  Location: [facility];  Service: Endoscopy;  Laterality: N/A;    History  Substance Use Topics  . Smoking status: Never Smoker   . Smokeless tobacco: Never Used  . Alcohol Use: Yes     Comment: 3 Drinks per week    Family History  Problem Relation Age of Onset  . Colon cancer Neg Hx   . Prostate cancer Father   . COPD  Mother   . Heart disease Mother   . Heart disease Maternal Grandmother   . Kidney disease Paternal Grandmother     Allergies  Allergen Reactions  . Penicillins Nausea Only  . Sulfa Antibiotics Nausea Only    Medication list has been reviewed and updated.  Current Outpatient Prescriptions on File Prior to Visit  Medication Sig Dispense Refill  . methylphenidate (DAYTRANA) 10 mg/9hr Place 1 patch onto the skin daily. wear patch for 9 hours only each day      . omeprazole (PRILOSEC) 40 MG capsule Take 1 capsule (40 mg total) by mouth daily.  30 capsule  3  . ranitidine (ZANTAC) 150 MG tablet Take 1 tablet (150 mg total) by mouth 2 (two) times daily.  60 tablet  3    Review of Systems:  As per HPI, otherwise negative.    Physical Examination: Filed Vitals:   05/09/12 0751  BP: 147/91  Pulse: 99  Temp: 98.5 F (36.9 C)  Resp: 18   Filed Vitals:   05/09/12 0751  Height: 5' 10.5" (1.791 m)  Weight: 213 lb (96.616 kg)   Body mass index is 30.13 kg/(m^2). Ideal Body Weight: Weight in (lb) to have BMI = 25: 176.4   GEN: WDWN, NAD, Non-toxic, A & O x 3 HEENT: Atraumatic, Normocephalic. Neck supple. No masses, No LAD.  Not icteric Ears  and Nose: No external deformity. CV: RRR, No M/G/R. No JVD. No thrill. No extra heart sounds. PULM: CTA B, no wheezes, crackles, rhonchi. No retractions. No resp. distress. No accessory muscle use. ABD: S, NT, ND, +BS. No rebound. No HSM. EXTR: No c/c/e NEURO Normal gait.  PSYCH: Normally interactive. Conversant. Not depressed or anxious appearing.  Calm demeanor.    Assessment and Plan: Liver mass Continue with MRI Change from H2 to Dexilant carafate Cough:  Flu test  Carmelina Dane, MD  Results for orders placed in visit on 05/09/12  POCT INFLUENZA A/B      Component Value Range   Influenza A, POC Negative     Influenza B, POC Negative

## 2012-05-09 NOTE — Telephone Encounter (Signed)
Apparently from what I can tell from the records he came in to see Dr. Dareen Piano this morning. Hopefully he discussed the issues regarding his MRI with Dr. Dareen Piano. If he still has questions please do not hesitate to have him call me . Since Dr. Ewell Poe is in the office this morning  just ask him if there are any other issues that I  need to address.

## 2012-05-09 NOTE — Telephone Encounter (Signed)
Please call patient and let him know it would be safest to proceed with his MRI. This would assure [place] that the lesions seen on x-ray are benign. I hope to discuss this issue with Dr. Dareen Piano when he saw him yesterday.

## 2012-05-09 NOTE — Telephone Encounter (Signed)
Thanks, Dr Dareen Piano has taken care of him.

## 2012-05-10 ENCOUNTER — Emergency Department (HOSPITAL_COMMUNITY)
Admission: EM | Admit: 2012-05-10 | Discharge: 2012-05-10 | Disposition: A | Discharge status: Home / Self Care | Payer: Managed Care, Other (non HMO) | Attending: Emergency Medicine | Admitting: Emergency Medicine

## 2012-05-10 ENCOUNTER — Emergency Department (HOSPITAL_COMMUNITY): Payer: Managed Care, Other (non HMO)

## 2012-05-10 ENCOUNTER — Encounter (HOSPITAL_COMMUNITY): Payer: Self-pay | Admitting: *Deleted

## 2012-05-10 DIAGNOSIS — R112 Nausea with vomiting, unspecified: Secondary | ICD-10-CM | POA: Insufficient documentation

## 2012-05-10 DIAGNOSIS — Z9889 Other specified postprocedural states: Secondary | ICD-10-CM | POA: Insufficient documentation

## 2012-05-10 DIAGNOSIS — Z8719 Personal history of other diseases of the digestive system: Secondary | ICD-10-CM

## 2012-05-10 DIAGNOSIS — R109 Unspecified abdominal pain: Secondary | ICD-10-CM | POA: Insufficient documentation

## 2012-05-10 DIAGNOSIS — F909 Attention-deficit hyperactivity disorder, unspecified type: Secondary | ICD-10-CM | POA: Insufficient documentation

## 2012-05-10 DIAGNOSIS — K222 Esophageal obstruction: Secondary | ICD-10-CM | POA: Insufficient documentation

## 2012-05-10 DIAGNOSIS — K219 Gastro-esophageal reflux disease without esophagitis: Secondary | ICD-10-CM | POA: Insufficient documentation

## 2012-05-10 DIAGNOSIS — Z79899 Other long term (current) drug therapy: Secondary | ICD-10-CM | POA: Insufficient documentation

## 2012-05-10 LAB — URINALYSIS, MICROSCOPIC ONLY
Bilirubin Urine: NEGATIVE
Glucose, UA: NEGATIVE mg/dL
Hgb urine dipstick: NEGATIVE
Ketones, ur: NEGATIVE mg/dL
Leukocytes, UA: NEGATIVE
Protein, ur: NEGATIVE mg/dL
pH: 6 (ref 5.0–8.0)

## 2012-05-10 LAB — CBC WITH DIFFERENTIAL/PLATELET
Basophils Absolute: 0 10*3/uL (ref 0.0–0.1)
HCT: 44.3 % (ref 39.0–52.0)
Hemoglobin: 15.1 g/dL (ref 13.0–17.0)
Lymphocytes Relative: 29 % (ref 12–46)
Monocytes Absolute: 0.5 10*3/uL (ref 0.1–1.0)
Monocytes Relative: 8 % (ref 3–12)
Neutro Abs: 4.3 10*3/uL (ref 1.7–7.7)
Neutrophils Relative %: 61 % (ref 43–77)
RDW: 13.1 % (ref 11.5–15.5)
WBC: 7.1 10*3/uL (ref 4.0–10.5)

## 2012-05-10 LAB — COMPREHENSIVE METABOLIC PANEL
AST: 15 U/L (ref 0–37)
Albumin: 3.8 g/dL (ref 3.5–5.2)
Alkaline Phosphatase: 56 U/L (ref 39–117)
CO2: 23 mEq/L (ref 19–32)
Chloride: 105 mEq/L (ref 96–112)
Creatinine, Ser: 0.86 mg/dL (ref 0.50–1.35)
GFR calc non Af Amer: >90 mL/min (ref >90)
Potassium: 3.2 mEq/L — ABNORMAL LOW (ref 3.5–5.1)
Total Bilirubin: 0.6 mg/dL (ref 0.3–1.2)

## 2012-05-10 MED ORDER — ONDANSETRON HCL 4 MG/2ML IJ SOLN
4.0000 mg | Freq: Once | INTRAMUSCULAR | Status: AC
  Administered 2012-05-10: 4 mg via INTRAVENOUS
  Filled 2012-05-10: qty 2

## 2012-05-10 MED ORDER — HYDROCODONE-ACETAMINOPHEN 5-325 MG PO TABS: 1.0000 | ORAL_TABLET | Freq: Four times a day (QID) | ORAL | Status: DC | PRN

## 2012-05-10 MED ORDER — IOHEXOL 300 MG/ML  SOLN
100.0000 mL | Freq: Once | INTRAMUSCULAR | Status: AC | PRN
  Administered 2012-05-10: 100 mL via INTRAVENOUS

## 2012-05-10 MED ORDER — SODIUM CHLORIDE 0.9 % IV BOLUS (SEPSIS)
1000.0000 mL | Freq: Once | INTRAVENOUS | Status: AC
  Administered 2012-05-10: 1000 mL via INTRAVENOUS

## 2012-05-10 MED ORDER — HYDROMORPHONE HCL PF 1 MG/ML IJ SOLN
1.0000 mg | Freq: Once | INTRAMUSCULAR | Status: AC
  Administered 2012-05-10: 1 mg via INTRAVENOUS
  Filled 2012-05-10: qty 1

## 2012-05-10 MED ORDER — ONDANSETRON HCL 4 MG PO TABS: 4.0000 mg | ORAL_TABLET | Freq: Four times a day (QID) | ORAL | Status: DC

## 2012-05-10 NOTE — ED Notes (Signed)
Pt returned from ct

## 2012-05-10 NOTE — ED Provider Notes (Signed)
History     CSN: [id number]  Arrival date & time 05/10/12  0445   First MD Initiated Contact with Patient 05/10/12 0522      Chief Complaint  Patient presents with  . Emesis  . Abdominal Pain    (Consider location/radiation/quality/duration/timing/severity/associated sxs/prior treatment) HPIShawn Steele is a 41 y.o. male with a past medical history significant for GERD, esophageal stricture status post dilation, also umbilical hernia repair in 2001. Patient presents with abdominal pain is periumbilical in the region of his hernia repair. Patient's pain is moderate to severe, sharp. Is been worsening over the past 2 weeks. He is also complaining about nausea and vomiting. Patient is a bit nervous and is being evaluated by liver mass by MRI found on ultrasound.  Patient is had trouble with sleep recently and some anxiety.  Past Medical History  Diagnosis Date  . GERD (gastroesophageal reflux disease)   . ADHD (attention deficit hyperactivity disorder)   . Esophageal stricture     Past Surgical History  Procedure Date  . Hernia repair     umblical hernia repair  . Tonsillectomy   . Adenoidectomy   . Esophagogastroduodenoscopy 11/03/2011    Procedure: ESOPHAGOGASTRODUODENOSCOPY (EGD);  Surgeon: Hart Carwin, MD;  Location: [facility];  Service: Endoscopy;  Laterality: N/A;  . Savory dilation 11/03/2011    Procedure: SAVORY DILATION;  Surgeon: Hart Carwin, MD;  Location: [facility];  Service: Endoscopy;  Laterality: N/A;    Family History  Problem Relation Age of Onset  . Colon cancer Neg Hx   . Prostate cancer Father   . COPD Mother   . Heart disease Mother   . Heart disease Maternal Grandmother   . Kidney disease Paternal Grandmother     History  Substance Use Topics  . Smoking status: Never Smoker   . Smokeless tobacco: Never Used  . Alcohol Use: Yes     Comment: 3 Drinks per week      Review of Systems At least 10pt or greater review of systems completed  and are negative except where specified in the HPI.  Allergies  Codeine; Penicillins; and Sulfa antibiotics  Home Medications   Current Outpatient Rx  Name  Route  Sig  Dispense  Refill  . ACETAMINOPHEN 500 MG PO TABS   Oral   Take 500 mg by mouth every 6 (six) hours as needed. For pain         . CALCIUM CARBONATE ANTACID 500 MG PO CHEW   Oral   Chew 1 tablet by mouth 2 (two) times daily as needed. For acid reflux or heartburn         . METHYLPHENIDATE 10 MG/9HR TD PTCH   Transdermal   Place 1 patch onto the skin daily. wear patch for 9 hours only each day         . OMEPRAZOLE 40 MG PO CPDR   Oral   Take 40 mg by mouth every morning.         Marland Kitchen RANITIDINE HCL 150 MG PO TABS   Oral   Take 1 tablet (150 mg total) by mouth 2 (two) times daily.   60 tablet   3   . LORAZEPAM 1 MG PO TABS   Oral   Take 1 mg by mouth every 8 (eight) hours as needed. For anxiety         . SUCRALFATE 1 G PO TABS   Oral   Take 1 g by mouth 4 (four) times  daily -  before meals and at bedtime. Take 1 tablet 1 hour before meals and at bedtime           BP 162/94  Pulse 120  Temp 99.1 F (37.3 C) (Oral)  Resp 20  SpO2 100%  Physical Exam  Nursing notes reviewed.  Electronic medical record reviewed. VITAL SIGNS:   Filed Vitals:   05/10/12 0455 05/10/12 0809  BP: 162/94 134/82  Pulse: 120 98  Temp: 99.1 F (37.3 C)   TempSrc: Oral   Resp: 20 16  SpO2: 100% 99%   CONSTITUTIONAL: Awake, oriented, appears non-toxic HENT: Atraumatic, normocephalic, oral mucosa pink and moist, airway patent. Nares patent without drainage. External ears normal. EYES: Conjunctiva clear, EOMI, PERRLA NECK: Trachea midline, non-tender, supple CARDIOVASCULAR: Normal heart rate, Normal rhythm, No murmurs, rubs, gallops PULMONARY/CHEST: Clear to auscultation, no rhonchi, wheezes, or rales. Symmetrical breath sounds. Non-tender. ABDOMINAL: Non-distended, soft. Small grape size knot inside the  umbilicus it is mildly tender to palpation - feels like a firm area of scar tissue not like a hernia. BS normal. NEUROLOGIC: Non-focal, moving all four extremities, no gross sensory or motor deficits. EXTREMITIES: No clubbing, cyanosis, or edema SKIN: Warm, Dry, No erythema, No rash  ED Course  Procedures (including critical care time)  Labs Reviewed  COMPREHENSIVE METABOLIC PANEL - Abnormal; Notable for the following:    Potassium 3.2 (*)     Glucose, Bld 102 (*)     All other components within normal limits  URINALYSIS, MICROSCOPIC ONLY - Abnormal; Notable for the following:    APPearance CLOUDY (*)     All other components within normal limits  CBC WITH DIFFERENTIAL  LIPASE, BLOOD   Ct Abdomen Pelvis W Contrast  05/10/2012  *RADIOLOGY REPORT*  Clinical Data: Umbilical pain  CT ABDOMEN AND PELVIS WITH CONTRAST  Technique:  Multidetector CT imaging of the abdomen and pelvis was performed following the standard protocol during bolus administration of intravenous contrast.  Contrast: OMNIPAQUE IOHEXOL 300 MG/ML  SOLN  Comparison: None.  Findings: 1.0 cm hypodensity at the dome of the liver on image 19. It is homogeneous.  Spleen, gallbladder, pancreas, adrenal glands are within normal limits.  Kidneys are lobulated.  Bladder, prostate, seminal vesicles, and appendix are within normal limits.  No disproportionate dilatation of bowel.  Diverticulosis of the descending colon without evidence of acute diverticulitis.  No free fluid.  No abnormal adenopathy.  Left paracentral L5-S1 disc herniation.  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  IMPRESSION: 1.0 cm hypodensity in the liver.  The differential diagnosis and recommendations for this lesion were discussed on the recent ultrasound.  The differential is reiterated and instructions are again recommended.  Sigmoid diverticulosis without evidence of acute diverticulitis.  Lumbar degenerative disc disease.   Original  Report Authenticated By: Jolaine Click, M.D.      1. Stricture and stenosis of esophagus   2. Nausea and vomiting   3. Abdominal pain   4. H/O umbilical hernia repair      Medications  acetaminophen (TYLENOL) 500 MG tablet (not administered)  calcium carbonate (TUMS - DOSED IN MG ELEMENTAL CALCIUM) 500 MG chewable tablet (not administered)  sucralfate (CARAFATE) 1 G tablet (not administered)  LORazepam (ATIVAN) 1 MG tablet (not administered)  omeprazole (PRILOSEC) 40 MG capsule (not administered)  HYDROcodone-acetaminophen (NORCO/VICODIN) 5-325 MG per tablet (not administered)  ondansetron (ZOFRAN) 4 MG tablet (not administered)  HYDROmorphone (DILAUDID) injection 1 mg (1 mg Intravenous Given 05/10/12 0605)  ondansetron [facility]) injection 4 mg (4 mg Intravenous Given 05/10/12 0605)  sodium chloride 0.9 % bolus 1,000 mL (0 mL Intravenous Stopped 05/10/12 0825)  iohexol (OMNIPAQUE) 300 MG/ML solution 100 mL (100 mL Intravenous Contrast Given 05/10/12 0800)     MDM  Jesse Steele is a 41 y.o. male presenting with abdominal pain, nausea and vomiting this is worsened by 18. He is a prior surgical history, he is also a history of esophageal stricture being treated with dilation. Patient's abdomen is benign at this point patient is somewhat tachycardic however he is nervous and also taking methylphenidate for ADHD. She does have some rapid speech I think this is likely nervousness and anxiety.  Treat patient's pain, labs are unremarkable, CT of the abdomen and pelvis fails to show any failure of his surgical repair, or new intra-abdominal process that is acute and requiring emergent intervention. Patient does have some sigmoid diverticulosis without diverticulitis.  Hypodensity in the liver is once again redemonstrated.  Discussed findings with patient of workup - he'll followup with his GI specialist and surgeon for continued symptoms.  I explained the diagnosis and have given explicit  precautions to return to the ER including any other new or worsening symptoms. The patient understands and accepts the medical plan as it's been dictated and I have answered their questions. Discharge instructions concerning home care and prescriptions have been given.  The patient is STABLE and is discharged to home in good condition.         Jones Skene, MD 05/10/12 0900

## 2012-05-10 NOTE — ED Notes (Signed)
Pt c/o periumbilical abdominal pain x approximately 2 weeks. Pt has hx of umbilical hernia. Pt has been seen for this pain on several occasions in the past couple of weeks. Pt states cannot eat w/o pain, n/v. Pt presents w/ tachycardia, slightly hypertensive, and slight temperature. Pt has hx of GERD.

## 2012-05-10 NOTE — ED Notes (Signed)
Pt has finished his contrast.

## 2012-05-25 ENCOUNTER — Other Ambulatory Visit: Payer: Self-pay | Admitting: Internal Medicine

## 2012-05-25 NOTE — Telephone Encounter (Signed)
Yes, he ought to make an appointment to see me. In the meantime he can take OTC Omeprazole 40 mg/day

## 2012-05-25 NOTE — Telephone Encounter (Signed)
Patient has been advised that Dr Juanda Chance feels he needs to be seen for an office visit for discussion of his symptoms and medications. I have asked that he use OTC omeprazole until that time. Patient has been scheduled for an appointment on 06-09-12 @ 8:45 am. Patient verbalizes understanding of all of the above.

## 2012-05-25 NOTE — Telephone Encounter (Signed)
Patient was on omeprazole, then Protonix. He was later seen at [facility] with breakthrough symptoms. He was given Zantac twice daily at that time. It appears that he went back and was changed to Dexilant (05/10/12). Patient now states that the Dexilant upset his stomach and he wants omeprazole instead. He is taking Zantac once daily now. It appears that patient was also seen in the E.R recently for abdominal pain, nausea and vomiting. He was also found to have liver lesions not yet evaluated by MRI. E.R. Report states that patient needs to follow up with his GI specialist. However, patient has not scheduled an appointment. Would you like me to have him come in to discuss these symptoms and medication changes or do you want me to continue refilling meds?

## 2012-05-27 ENCOUNTER — Ambulatory Visit (INDEPENDENT_AMBULATORY_CARE_PROVIDER_SITE_OTHER): Payer: Managed Care, Other (non HMO) | Admitting: Family Medicine

## 2012-05-27 VITALS — BP 138/83 | HR 77 | Temp 98.9°F | Resp 18 | Ht 70.0 in | Wt 213.0 lb

## 2012-05-27 DIAGNOSIS — K7689 Other specified diseases of liver: Secondary | ICD-10-CM

## 2012-05-27 DIAGNOSIS — K769 Liver disease, unspecified: Secondary | ICD-10-CM | POA: Insufficient documentation

## 2012-05-27 DIAGNOSIS — K219 Gastro-esophageal reflux disease without esophagitis: Secondary | ICD-10-CM

## 2012-05-27 DIAGNOSIS — R1319 Other dysphagia: Secondary | ICD-10-CM

## 2012-05-27 DIAGNOSIS — R634 Abnormal weight loss: Secondary | ICD-10-CM

## 2012-05-27 LAB — TSH: TSH: 1.136 u[IU]/mL (ref 0.350–4.500)

## 2012-05-27 MED ORDER — OMEPRAZOLE 40 MG PO CPDR: 40.0000 mg | DELAYED_RELEASE_CAPSULE | Freq: Every morning | ORAL | Status: DC

## 2012-05-27 NOTE — Progress Notes (Signed)
Subjective:    Patient ID: Jesse Steele, male    DOB: 1970-07-14, 42 y.o.   MRN: [id number] Chief Complaint  Patient presents with  . Follow-up    stomach pain; weight loss    HPI  Jesse Steele is a 43 yo male who has recently developed some GI problems.  He did have an upper endoscopy with stricture stretched in July, he was off of ppi from Auguest to November and has GI f/u on 1/29 w/ Jesse Steele. He states that he is on a land diet for diverticulosis and supposed to stay away from greasy foods.  He initially had an abd [place] which showed a liver lesion - an MRI was recommended and scheduled but then pt developed acute abd pain and went to the ER where they did a abd CT scan and told him everything was fine so he cancelled the MRI.  On the CT report - the radiologist again noted that this was not the correct imaging study to visualize the liver. Pt is here today because he feels like his throat is closing up, felt like he was going to choke whenever he tries to eat meat - steak or chicken, gets stuck in his throat and it feels like his throat is swollen. Stomach rumbling a lot. According to at home scale he has lost about 10 lbs since this summer.  He is trying to exercise a lot. Wife thinks it could be mental.   Past Medical History  Diagnosis Date  . GERD (gastroesophageal reflux disease)   . ADHD (attention deficit hyperactivity disorder)   . Esophageal stricture   . Anxiety   . Liver lesion   . Diverticulosis   . Lumbar degenerative disc disease    Current Outpatient Prescriptions on File Prior to Visit  Medication Sig Dispense Refill  . omeprazole (PRILOSEC) 40 MG capsule Take 1 capsule (40 mg total) by mouth every morning.  30 capsule  2  . ranitidine (ZANTAC) 150 MG tablet Take 1 tablet (150 mg total) by mouth 2 (two) times daily.  60 tablet  3  . acetaminophen (TYLENOL) 500 MG tablet Take 500 mg by mouth every 6 (six) hours as needed. For pain      . calcium carbonate (TUMS - DOSED IN  MG ELEMENTAL CALCIUM) 500 MG chewable tablet Chew 1 tablet by mouth 2 (two) times daily as needed. For acid reflux or heartburn      . HYDROcodone-acetaminophen (NORCO/VICODIN) 5-325 MG per tablet Take 1-2 tablets by mouth every 6 (six) hours as needed for pain.  17 tablet  0  . LORazepam (ATIVAN) 1 MG tablet Take 1 mg by mouth every 8 (eight) hours as needed. For anxiety      . methylphenidate (DAYTRANA) 10 mg/9hr Place 1 patch onto the skin daily. wear patch for 9 hours only each day      . ondansetron (ZOFRAN) 4 MG tablet Take 1 tablet (4 mg total) by mouth every 6 (six) hours.  12 tablet  0  . sucralfate (CARAFATE) 1 G tablet Take 1 g by mouth 4 (four) times daily -  before meals and at bedtime. Take 1 tablet 1 hour before meals and at bedtime       Allergies  Allergen Reactions  . Codeine Other (See Comments)    Reaction unknown (family allergy)  . Penicillins Nausea Only  . Sulfa Antibiotics Nausea Only     Review of Systems  Constitutional: Positive for appetite change and unexpected  weight change. Negative for fever, chills, diaphoresis and activity change.  HENT: Positive for trouble swallowing. Negative for sore throat, neck pain, neck stiffness and voice change.   Respiratory: Negative for chest tightness and shortness of breath.   Cardiovascular: Negative for chest pain.  Gastrointestinal: Positive for abdominal pain. Negative for vomiting, diarrhea, constipation, blood in stool, anal bleeding and rectal pain.  Hematological: Positive for adenopathy.  Psychiatric/Behavioral: Negative for dysphoric mood. The patient is nervous/anxious.       BP 138/83  Pulse 77  Temp 98.9 F (37.2 C) (Oral)  Resp 18  Ht 5\' 10"  (1.778 m)  Wt 213 lb (96.616 kg)  BMI 30.56 kg/m2  SpO2 98% Objective:   Physical Exam  Constitutional: He is oriented to person, place, and time. He appears well-developed and well-nourished. No distress.  HENT:  Head: Normocephalic and atraumatic.  Eyes:  Conjunctivae normal are normal. Pupils are equal, round, and reactive to light. No scleral icterus.  Neck: Normal range of motion. Neck supple. No tracheal deviation present. No mass and no thyromegaly present.  Cardiovascular: Normal rate, regular rhythm, normal heart sounds and intact distal pulses.   Pulmonary/Chest: Effort normal and breath sounds normal. No respiratory distress.  Musculoskeletal: He exhibits no edema.  Lymphadenopathy:       Head (right side): No submental, no submandibular, no preauricular and no posterior auricular adenopathy present.       Head (left side): No submental, no submandibular, no preauricular and no posterior auricular adenopathy present.    He has no cervical adenopathy.       Right: No supraclavicular adenopathy present.       Left: No supraclavicular adenopathy present.  Neurological: He is alert and oriented to person, place, and time.  Skin: Skin is warm and dry. He is not diaphoretic.  Psychiatric: He has a normal mood and affect. His behavior is normal.      Assessment & Plan:  Dysphagia - I rec to pt that he discuss with GI, Jesse Steele, getting a liver MRI with and without contrast with a dedicated hepatic focus to fully evaluate liver lesions.  If  He continues to have difficulty swallowing meats, consider a barium swallow - pt to discuss with Jesse Steele. I strongly recommend starting the anxiety medications prev rx'ed to him- prozac and  Ativan. Weightloss - Recording your weights at home so we can accurately track them over time.  His weight here has not sig changed and he admits that his diet has involuntarily became healthier due to his GI problems so I think his weight changes are benign - just cont to watch.

## 2012-05-27 NOTE — Patient Instructions (Addendum)
You may want to consider getting an MRI of your liver with and without contrast with a dedicated hepatic focus to fully evaluate your liver lesions.  If you continue to have difficulty swallowing meats, you could consider checking your esophageal function with a barium swallow.  Discuss these options with Dr. Dickie La. I strongly recommend starting the anxiety medications - prozac is great for long term, slow onset, minimal side effects while the ativan will work for the short-term, more immediate but can cause dizziness and fatigue.  I recommend recording your weights at home so we can accurately track them over time.

## 2012-05-29 ENCOUNTER — Telehealth: Payer: Self-pay

## 2012-05-29 NOTE — Telephone Encounter (Signed)
Called pt advised thyroid normal. Pt understood.

## 2012-05-29 NOTE — Telephone Encounter (Signed)
PT STATES HE HAVE BEEN WAITING TO HEAR ABOUT HIS THYROID TEST HE HAD DONE AND DIDN'T KNOW IF RESULTS WERE BACK PLEASE CALL [phone]

## 2012-06-09 ENCOUNTER — Ambulatory Visit: Payer: Managed Care, Other (non HMO) | Admitting: Internal Medicine

## 2012-06-28 ENCOUNTER — Encounter: Payer: Self-pay | Admitting: Internal Medicine

## 2012-06-28 ENCOUNTER — Ambulatory Visit (INDEPENDENT_AMBULATORY_CARE_PROVIDER_SITE_OTHER): Payer: Managed Care, Other (non HMO) | Admitting: Internal Medicine

## 2012-06-28 VITALS — BP 110/76 | HR 68 | Ht 69.5 in | Wt 216.0 lb

## 2012-06-28 DIAGNOSIS — R1319 Other dysphagia: Secondary | ICD-10-CM

## 2012-06-28 DIAGNOSIS — K219 Gastro-esophageal reflux disease without esophagitis: Secondary | ICD-10-CM

## 2012-06-28 DIAGNOSIS — R110 Nausea: Secondary | ICD-10-CM

## 2012-06-28 MED ORDER — ESOMEPRAZOLE MAGNESIUM 40 MG PO CPDR: 40.0000 mg | DELAYED_RELEASE_CAPSULE | Freq: Every day | ORAL | Status: DC

## 2012-06-28 MED ORDER — RANITIDINE HCL 150 MG PO TABS: 150.0000 mg | ORAL_TABLET | ORAL | Status: DC | PRN

## 2012-06-28 MED ORDER — LORAZEPAM 1 MG PO TABS: ORAL_TABLET | ORAL | Status: DC

## 2012-06-28 NOTE — Progress Notes (Signed)
Jesse Steele Nov 09, 1970 MRN [id number]   History of Present Illness:  This is a 42 year old white male with gastroesophageal reflux disease and recurrent heartburn. He also has dysphagia; specifically to pork and steaks. He is unable to eat meat which comes back almost immediately. He denies any dysphagia to liquids. He had an upper endoscopy and esophageal dilation in June 2013 with findings of duodenitis and a mild stricture. He was dilated with Savary dilators 15 and 16 mm with marked improvement in his dysphagia. He did not  take his medication as he was supposed to and ended up in the emergency room in December with abdominal pain. A CT scan of the abdomen showed a 1.4 cm hypo-dense lesion in the liver of unclear etiology. This was also seen on ultrasound. An MRI of the liver was recommended but not pursued. He has a history of attention deficit disorder controlled with methylphenidate. He has had a weight loss of 10 pounds. He is currently on Prilosec 40 mg in the morning and ranitidine 150 mg in the morning with breakthrough heartburn. He denies nocturnal cough or hoarseness. He is a very tense and stressed out individual and his wife attributes his symptoms to this.    Past Medical History  Diagnosis Date  . GERD (gastroesophageal reflux disease)   . ADHD (attention deficit hyperactivity disorder)   . Esophageal stricture   . Anxiety   . Liver lesion   . Diverticulosis   . Lumbar degenerative disc disease    Past Surgical History  Procedure Laterality Date  . Umbilical hernia repair    . Tonsillectomy    . Adenoidectomy    . Esophagogastroduodenoscopy  11/03/2011    Procedure: ESOPHAGOGASTRODUODENOSCOPY (EGD);  Surgeon: Hart Carwin, MD;  Location: [facility];  Service: Endoscopy;  Laterality: N/A;  . Savory dilation  11/03/2011    Procedure: SAVORY DILATION;  Surgeon: Hart Carwin, MD;  Location: [facility];  Service: Endoscopy;  Laterality: N/A;    reports that he has never  smoked. He has never used smokeless tobacco. He reports that  drinks alcohol. He reports that he does not use illicit drugs. family history includes COPD in his mother; Heart disease in his maternal grandmother and mother; Kidney disease in his paternal grandmother; and Prostate cancer in his father.  There is no history of Colon cancer. Allergies  Allergen Reactions  . Codeine Other (See Comments)    Reaction unknown (family allergy)  . Penicillins Nausea Only  . Sulfa Antibiotics Nausea Only        Review of Systems: Dysphagia to solids. Denies abdominal pain change in bowel habits  The remainder of the 10 point ROS is negative except as outlined in H&P   Physical Exam: General appearance  Well developed, in no distress. Anxious Eyes- non icteric. HEENT nontraumatic, normocephalic. Mouth no lesions, tongue papillated, no cheilosis. Neck supple without adenopathy, thyroid not enlarged, no carotid bruits, no JVD. Lungs Clear to auscultation bilaterally. Cor normal S1, normal S2, regular rhythm, no murmur,  quiet precordium. Abdomen: Soft nontender with normoactive bowel sounds. No distention. Subcutaneous fibroma in the right middle quadrant.  Rectal: Not done. Extremities no pedal edema. Skin no lesions. Neurological alert and oriented x 3. Psychological normal mood and affect.  Assessment and Plan:  Problem #1 Chronic gastroesophageal reflux disease with history of esophageal stricture and recurrent dysphagia. Some of his symptoms may be related to globus sensation. He is unable to swallow meats which to me suggests mechanical obstruction.Marland Kitchen  We will change  Prilosec to Nexium 40 mg in the morning and I asked him to take his ranitidine at night if needed. We will schedule an upper endoscopy with possible dilation. I would also add Ativan 1 mg before supper for globus sensation.since his symptoms occur at the end of the day. Barium esophagram may be needed for further  eval.  Pproblem#2 Liver lesion- 1.4 cm hypodense  Lesion, low risk for HCC, discussed with the patient. I recommend  A follow up MRI of the liver in 1 year- January 2015.   06/28/2012 Lina Sar

## 2012-06-28 NOTE — Patient Instructions (Addendum)
You have been scheduled for an endoscopy with propofol. Please follow written instructions given to you at your visit today. If you use inhalers (even only as needed) or a CPAP machine, please bring them with you on the day of your procedure.  We have sent the following medications to your pharmacy for you to pick up at your convenience: Ativan  We have sent the following medications to your mail in pharmacy: Nexium (in place of omeprazole and ranitidine)  CC: Dr Robert Bellow

## 2012-06-29 ENCOUNTER — Encounter: Payer: Self-pay | Admitting: Internal Medicine

## 2012-06-29 ENCOUNTER — Ambulatory Visit (AMBULATORY_SURGERY_CENTER): Payer: Managed Care, Other (non HMO) | Admitting: Internal Medicine

## 2012-06-29 VITALS — BP 119/57 | HR 58 | Temp 96.8°F | Resp 19 | Ht 69.5 in | Wt 216.0 lb

## 2012-06-29 DIAGNOSIS — R110 Nausea: Secondary | ICD-10-CM

## 2012-06-29 DIAGNOSIS — K219 Gastro-esophageal reflux disease without esophagitis: Secondary | ICD-10-CM

## 2012-06-29 DIAGNOSIS — R1310 Dysphagia, unspecified: Secondary | ICD-10-CM

## 2012-06-29 MED ORDER — SODIUM CHLORIDE 0.9 % IV SOLN: 500.0000 mL | INTRAVENOUS | Status: DC

## 2012-06-29 NOTE — Progress Notes (Addendum)
Patient did not have preoperative order for IV antibiotic SSI prophylaxis. (G8918)  Patient did not experience any of the following events: a burn prior to discharge; a fall within the facility; wrong site/side/patient/procedure/implant event; or a hospital transfer or hospital admission upon discharge from the facility. (G8907)  

## 2012-06-29 NOTE — Patient Instructions (Addendum)
YOU HAD AN ENDOSCOPIC PROCEDURE TODAY AT THE [facility]: Refer to the procedure report that was given to you for any specific questions about what was found during the examination.  If the procedure report does not answer your questions, please call your gastroenterologist to clarify.  If you requested that your care partner not be given the details of your procedure findings, then the procedure report has been included in a sealed envelope for you to review at your convenience later.  YOU SHOULD EXPECT: Some feelings of bloating in the abdomen. Passage of more gas than usual.  Walking can help get rid of the air that was put into your GI tract during the procedure and reduce the bloating. If you had a lower endoscopy (such as a colonoscopy or flexible sigmoidoscopy) you may notice spotting of blood in your stool or on the toilet paper. If you underwent a bowel prep for your procedure, then you may not have a normal bowel movement for a few days.  DIET: PLEASE, STAY ON A SOFT DIET FOR TODAY PER DR BRODIE.  Likewise meals heavy in dairy and vegetables can cause extra gas to form and this can also increase the bloating.  Drink plenty of fluids but you should avoid alcoholic beverages for 24 hours.  ACTIVITY: Your care partner should take you home directly after the procedure.  You should plan to take it easy, moving slowly for the rest of the day.  You can resume normal activity the day after the procedure however you should NOT DRIVE or use heavy machinery for 24 hours (because of the sedation medicines used during the test).    SYMPTOMS TO REPORT IMMEDIATELY: A gastroenterologist can be reached at any hour.  During normal business hours, 8:30 AM to 5:00 PM Monday through Friday, call [phone].  After hours and on weekends, please call the GI answering service at [phone] who will take a message and have the physician on call contact you.   Following upper endoscopy  (EGD)  Vomiting of blood or coffee ground material  New chest pain or pain under the shoulder blades  Painful or persistently difficult swallowing  New shortness of breath  Fever of 100F or higher  Black, tarry-looking stools  FOLLOW UP: If any biopsies were taken you will be contacted by phone or by letter within the next 1-3 weeks.  Call your gastroenterologist if you have not heard about the biopsies in 3 weeks.  Our staff will call the home number listed on your records the next business day following your procedure to check on you and address any questions or concerns that you may have at that time regarding the information given to you following your procedure. This is a courtesy call and so if there is no answer at the home number and we have not heard from you through the emergency physician on call, we will assume that you have returned to your regular daily activities without incident.  SIGNATURES/CONFIDENTIALITY: You and/or your care partner have signed paperwork which will be entered into your electronic medical record.  These signatures attest to the fact that that the information above on your After Visit Summary has been reviewed and is understood.  Full responsibility of the confidentiality of this discharge information lies with you and/or your care-partner.

## 2012-06-29 NOTE — Op Note (Signed)
[facility] [address] [place], [address]   ENDOSCOPY PROCEDURE REPORT  PATIENT: Jesse, Steele  MR#: [id number] BIRTHDATE: 1970/07/25 , 41  yrs. old GENDER: Male ENDOSCOPIST: Hart Carwin, MD REFERRED BY:  Robert Bellow, M.D. PROCEDURE DATE:  06/29/2012 PROCEDURE:  EGD, diagnostic and Savary dilation of esophagus ASA CLASS:     Class I INDICATIONS:  Dysphagia.   hx es.  stricture 10/2011- dilated, now recurrent globus- like sensation and dysphagia to steak and pork meats, hx of ADHD,GERD. MEDICATIONS: MAC sedation, administered by CRNA and propofol (Diprivan) 300mg  IV TOPICAL ANESTHETIC: Cetacaine Spray  DESCRIPTION OF PROCEDURE: After the risks benefits and alternatives of the procedure were thoroughly explained, informed consent was obtained.  The [facility] [id number] endoscope was introduced through the mouth and advanced to the second portion of the duodenum. Without limitations.  The instrument was slowly withdrawn as the mucosa was fully examined.      [Esophagus; esophageal mucosa appeared normal in the upper third of the esophagus, there was no evidence of stricture or spasm. Endoscope traversed into the stomach without difficulty. There was no evidence of hiatal hernia or esophageal stricture. Stomach; stomach was insufflated with air and showed normal rugal folds normal gastric antrum and pylorus. Retroflexion of the endoscope revealed normal fundus and cardia ,duodenal bulb showed mild inflammatory changes similar to the ones  found on previous endoscop,endoscope was then brought back into the stomach .guidewire was then placed through the endoscope and Savary dilator 18 mm passed without significant resistance, there was no blood on the dilator ,patient tolerated procedure well         The scope was then withdrawn from the patient and the procedure completed.  COMPLICATIONS: There were no complications. ENDOSCOPIC IMPRESSION:  1. Mild  duodenitis 2. normal esophagus stomach without evidence of esophageal stricture. 3. passage of 18 mm Savary dilator without resistance  RECOMMENDA  Patient's symptoms are likely related to globus sensation. He will use Ativan 1 mg prn globus sensation. He will continue on Nexium 40 mg in the morning and ranitidine 150 mg at bedtime. He will follow antireflux measures. If dysphagia continues we will plan to obtain a barium esophagram  REPEAT EXAM:no  eSigned:  Hart Carwin, MD 06/29/2012 12:09 PM   CC:  PATIENT NAME:  Jesse, Steele MR#: [id number]

## 2012-06-30 ENCOUNTER — Telehealth: Payer: Self-pay | Admitting: *Deleted

## 2012-06-30 NOTE — Telephone Encounter (Signed)
  Follow up Call-  Call back number 06/29/2012  Post procedure Call Back phone  # [phone] WIFE NUMBER MAY SPEAK TOHER  Permission to leave phone message Yes     Patient questions:  Do you have a fever, pain , or abdominal swelling? no Pain Score  0 *  Have you tolerated food without any problems? yes  Have you been able to return to your normal activities? yes  Do you have any questions about your discharge instructions: Diet   no Medications  no Follow up visit  no  Do you have questions or concerns about your Care? no  Actions: * If pain score is 4 or above: No action needed, pain <4.

## 2012-07-02 ENCOUNTER — Telehealth: Payer: Self-pay | Admitting: Internal Medicine

## 2012-07-02 MED ORDER — ESOMEPRAZOLE MAGNESIUM 40 MG PO CPDR: 40.0000 mg | DELAYED_RELEASE_CAPSULE | Freq: Every day | ORAL | Status: DC

## 2012-07-02 NOTE — Telephone Encounter (Signed)
Patient advised I have put 2 boxes of Nexium at the front desk for pick up. He verbalizes understanding.

## 2012-07-13 ENCOUNTER — Telehealth: Payer: Self-pay | Admitting: Internal Medicine

## 2012-07-13 NOTE — Telephone Encounter (Signed)
I have advised patient that we currently have no samples of Nexium. However, he states that he does have some omeprazole left over. I advised for him to take omeprazole in the morning and ranitidine in the evening until his Nexium prescription comes in. Patient states that the mail in pharmacy sent the Nexium prescription to the wrong address so he hasnt yet received his prescription.

## 2012-08-10 ENCOUNTER — Telehealth: Payer: Self-pay | Admitting: Family Medicine

## 2012-08-10 ENCOUNTER — Ambulatory Visit (INDEPENDENT_AMBULATORY_CARE_PROVIDER_SITE_OTHER): Payer: Managed Care, Other (non HMO) | Admitting: Family Medicine

## 2012-08-10 ENCOUNTER — Ambulatory Visit (HOSPITAL_COMMUNITY)
Admission: RE | Admit: 2012-08-10 | Discharge: 2012-08-10 | Disposition: A | Discharge status: Home / Self Care | Payer: Managed Care, Other (non HMO) | Source: Ambulatory Visit | Attending: Cardiology | Admitting: Cardiology

## 2012-08-10 VITALS — BP 134/84 | HR 73 | Temp 99.0°F | Resp 16 | Ht 69.0 in | Wt 218.0 lb

## 2012-08-10 DIAGNOSIS — M79661 Pain in right lower leg: Secondary | ICD-10-CM

## 2012-08-10 DIAGNOSIS — M79609 Pain in unspecified limb: Secondary | ICD-10-CM

## 2012-08-10 DIAGNOSIS — S93409A Sprain of unspecified ligament of unspecified ankle, initial encounter: Secondary | ICD-10-CM

## 2012-08-10 MED ORDER — DICLOFENAC SODIUM 75 MG PO TBEC: 75.0000 mg | DELAYED_RELEASE_TABLET | Freq: Two times a day (BID) | ORAL | Status: DC

## 2012-08-10 MED ORDER — DICLOFENAC SODIUM 1 % TD GEL: 2.0000 g | Freq: Four times a day (QID) | TRANSDERMAL | Status: DC

## 2012-08-10 NOTE — Patient Instructions (Addendum)
[facility] for the venous Doppler ultrasound.  If it is negative, diclofenac 75 mg twice a day stay off the foot as possible. Wear Swede-O splint.  Return if not improving.

## 2012-08-10 NOTE — Progress Notes (Signed)
Subjective: 10 days ago he played basketball with his son, and thinks he turned his ankle a little bit. He has very flat feet. He is persisted with right ankle pain, but also has been having pain into the calf. He said it's not real tight like it had a bad charley horse that. He has not noticed any swelling. 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  Objective: Right calf appears slightly swollen. I did not measure it compared to the left. Good pedal pulses. He's tender in the calf moderately. The ankle is very tender around the medial malleolus and just above the malleolus. He is extremely flat-footed .  Assessment: Ankle sprain Calf pain rule out DVT  Plan:   Venous Doppler study on his calf. If it is normal he is to take diclofenac twice a day and wear the ankle splint and stay off his foot as much as possible. He apparently just lost his job.   Doppler report came back negative. Treat with NSAID's and see how he does. Wear the Swede-O.  Spoke to patient on phone. He says he cannot take the pills due to his GERD. I sent and diclofenac gel prescription to his pharmacy.

## 2012-08-10 NOTE — Telephone Encounter (Signed)
Patient called stating that he went to the ER and they advised him that he had no blood clots. Pt would like to know what else he can do for the sprain ankle. Please call him back at [phone]. Thanks

## 2012-08-10 NOTE — Progress Notes (Signed)
*  PRELIMINARY RESULTS* Vascular Ultrasound Right lower extremity venous duplex has been completed.  Preliminary findings: Right:  No evidence of DVT, superficial thrombosis, or Baker's cyst.  Called report to Magda Paganini at Dr. Jenene Slicker office.   Farrel Demark, RDMS, RVT  08/10/2012, 5:34 PM

## 2012-08-11 NOTE — Telephone Encounter (Signed)
Doppler report came back negative. Treat with NSAID's and see how he does. Wear the Swede-O. Spoke to patient on phone. He says he cannot take the pills due to his GERD. I sent and diclofenac gel prescription to his pharmacy. This has been done by [facility].

## 2012-08-26 ENCOUNTER — Ambulatory Visit (INDEPENDENT_AMBULATORY_CARE_PROVIDER_SITE_OTHER): Payer: Managed Care, Other (non HMO) | Admitting: Family Medicine

## 2012-08-26 VITALS — BP 130/84 | HR 74 | Temp 98.8°F | Resp 16 | Ht 69.0 in | Wt 218.0 lb

## 2012-08-26 DIAGNOSIS — S86112S Strain of other muscle(s) and tendon(s) of posterior muscle group at lower leg level, left leg, sequela: Secondary | ICD-10-CM

## 2012-08-26 DIAGNOSIS — IMO0002 Reserved for concepts with insufficient information to code with codable children: Secondary | ICD-10-CM

## 2012-08-26 NOTE — Progress Notes (Signed)
Subjective: Patient is here for recheck with regard to that right leg injury. The Doppler study was negative. He has continued to gradually improve. The leg still has a little bit of swelling in it. He says ICU something for symptomatic relief. He is foundthat biofreeze helps him. He is not playing a basketball yet but he is progressing his activity. He has a birthmark on the lateral aspect of his right calf, and just lateral to the there is a little area of pigmentation. He says it is gradually getting smaller. The bruising in the foot has improved. His gastrocnemius muscle continues to get tight.  Objective: Mild swelling of right calf. Ecchymosis of the foot is almost resolved. Pedal pulses are good. He still walks with a slight limp. Calf as mild tightness in it. There is a little knot adjacent to where the area of pigmentation is.  Assessment: Muscle tear right calf, improved  Plan: Reassurance. This should continue to resolve with time if he just advances his activities gradually. Return if worse. If the pigmentation is get worse at anytime he is to come back.

## 2012-08-26 NOTE — Patient Instructions (Signed)
Gradually progress activity. Return if worse.

## 2012-10-19 ENCOUNTER — Telehealth: Payer: Self-pay | Admitting: Internal Medicine

## 2012-10-20 ENCOUNTER — Telehealth: Payer: Self-pay | Admitting: Internal Medicine

## 2012-10-20 MED ORDER — ESOMEPRAZOLE MAGNESIUM 40 MG PO CPDR: 40.0000 mg | DELAYED_RELEASE_CAPSULE | Freq: Every day | ORAL | Status: DC

## 2012-10-20 NOTE — Telephone Encounter (Signed)
Nexium sent to pt's pharmacy. Called pt to let him know he can pick up samples

## 2012-10-21 MED ORDER — ESOMEPRAZOLE MAGNESIUM 40 MG PO CPDR: 40.0000 mg | DELAYED_RELEASE_CAPSULE | Freq: Every day | ORAL | Status: DC

## 2012-10-21 NOTE — Telephone Encounter (Signed)
90 day supply sent to pt's pharmacy. Called pt to let him know.

## 2013-03-07 ENCOUNTER — Ambulatory Visit (INDEPENDENT_AMBULATORY_CARE_PROVIDER_SITE_OTHER): Payer: Managed Care, Other (non HMO) | Admitting: Family Medicine

## 2013-03-07 VITALS — BP 100/60 | HR 77 | Temp 99.1°F | Resp 16 | Ht 69.5 in | Wt 221.0 lb

## 2013-03-07 DIAGNOSIS — R197 Diarrhea, unspecified: Secondary | ICD-10-CM

## 2013-03-07 DIAGNOSIS — J209 Acute bronchitis, unspecified: Secondary | ICD-10-CM

## 2013-03-07 DIAGNOSIS — R509 Fever, unspecified: Secondary | ICD-10-CM

## 2013-03-07 LAB — POCT INFLUENZA A/B: Influenza B, POC: NEGATIVE

## 2013-03-07 MED ORDER — HYDROCODONE-HOMATROPINE 5-1.5 MG/5ML PO SYRP: 5.0000 mL | ORAL_SOLUTION | ORAL | Status: DC | PRN

## 2013-03-07 MED ORDER — AZITHROMYCIN 250 MG PO TABS: ORAL_TABLET | ORAL | Status: DC

## 2013-03-07 NOTE — Progress Notes (Signed)
Subjective: 42 year old man with history of several days of illness. He has low-grade fever. He has been coughing a lot. It started with and had sore throat and hoarseness, then quickly settled into his chest with a deep deep cough. He has been bringing up a lot of phlegm. He does not smoke. Others at work have been ill no one else at home is ill at this time. He is taken some over-the-counter Mucinex. He did have a number of loose BMs, with bowels moving about 4 times yesterday.  Objective: Healthy-appearing man. His TMs are normal. Total difficult to do well due to his gag reflex. The upper portion looks okay. His neck was supple without any significant nodes. Chest has some coarse rhonchi scattered throughout the lungs. He sounds congested when he clears his throat. His abdomen is very active bowel sounds, soft nontender.  Assessment: Fever, cough, loose bowels  Plan: Influenza swab then decide treatment  Results for orders placed in visit on 03/07/13  POCT INFLUENZA A/B      Result Value Range   Influenza A, POC Negative     Influenza B, POC Negative

## 2013-03-07 NOTE — Patient Instructions (Signed)
Drink plenty of fluids  Get enough rest  Continue Mucinex  Take the azithromycin 2 pills initially, then one daily for 4 days  Use the cough syrup 1 teaspoon every 4-6 hours as needed for cough  Take Pepto-Bismol if needed for your bowels and abdomen  Return if worse

## 2013-04-29 ENCOUNTER — Ambulatory Visit (INDEPENDENT_AMBULATORY_CARE_PROVIDER_SITE_OTHER): Payer: Managed Care, Other (non HMO) | Admitting: Physician Assistant

## 2013-04-29 VITALS — BP 124/74 | HR 74 | Temp 98.1°F | Resp 18 | Wt 221.0 lb

## 2013-04-29 DIAGNOSIS — H6981 Other specified disorders of Eustachian tube, right ear: Secondary | ICD-10-CM

## 2013-04-29 DIAGNOSIS — H6980 Other specified disorders of Eustachian tube, unspecified ear: Secondary | ICD-10-CM

## 2013-04-29 MED ORDER — TRIAMCINOLONE ACETONIDE 55 MCG/ACT NA AERO: 2.0000 | INHALATION_SPRAY | Freq: Every day | NASAL | Status: DC

## 2013-04-29 NOTE — Patient Instructions (Signed)
Guaifenesin to thin mucus secretions

## 2013-04-29 NOTE — Progress Notes (Signed)
   Subjective:    Patient ID: Jesse Steele, male    DOB: [birth year], 42 y.o.   MRN: [id number]  HPI Pt presents to clinic with 4 day h/o feeling off balance and wondering if something is going on with his ears.  He does not have pain but he does have pressure in both of his ears. He had a cold last week but the congestion and other cold symptoms have significantly improved.  He is not having dizziness or vertigo.  He had 1 episode of tinnitus last week but it has not reoccurred.  He has used no medications for it  Review of Systems  HENT: Negative for ear discharge and ear pain.   Neurological: Positive for light-headedness.       Objective:   Physical Exam  Vitals reviewed. Constitutional: He is oriented to person, place, and time. He appears well-developed and well-nourished.  HENT:  Head: Normocephalic.  Right Ear: Hearing, external ear and ear canal normal. Tympanic membrane is erythematous and bulging (serous fluid).  Left Ear: Hearing, tympanic membrane, external ear and ear canal normal.  Eyes: Conjunctivae are normal. Left eye exhibits nystagmus (to the Left and it fatigues).  Cardiovascular: Normal rate, regular rhythm and normal heart sounds.   No murmur heard. Pulmonary/Chest: Effort normal and breath sounds normal.  Neurological: He is alert and oriented to person, place, and time.  Skin: Skin is warm and dry.  Psychiatric: He has a normal mood and affect. His behavior is normal. Judgment and thought content normal.       Assessment & Plan:  ETD (eustachian tube dysfunction), right - Plan: triamcinolone (NASACORT ALLERGY 24HR) 55 MCG/ACT AERO nasal inhaler D/w pt that this may take several weeks for resolution. Pt will get OTC mucinex. Benny Lennert PA-C 04/29/2013 10:28 AM

## 2013-06-20 ENCOUNTER — Ambulatory Visit (INDEPENDENT_AMBULATORY_CARE_PROVIDER_SITE_OTHER): Payer: Managed Care, Other (non HMO) | Admitting: Internal Medicine

## 2013-06-20 VITALS — BP 124/80 | HR 99 | Temp 100.9°F | Resp 18 | Ht 69.0 in | Wt 226.0 lb

## 2013-06-20 DIAGNOSIS — R509 Fever, unspecified: Secondary | ICD-10-CM

## 2013-06-20 DIAGNOSIS — R112 Nausea with vomiting, unspecified: Secondary | ICD-10-CM

## 2013-06-20 LAB — POCT INFLUENZA A/B
Influenza A, POC: NEGATIVE
Influenza B, POC: NEGATIVE

## 2013-06-20 MED ORDER — ONDANSETRON 4 MG PO TBDP
8.0000 mg | ORAL_TABLET | Freq: Once | ORAL | Status: AC
  Administered 2013-06-20: 8 mg via ORAL

## 2013-06-20 MED ORDER — ONDANSETRON HCL 4 MG PO TABS: 4.0000 mg | ORAL_TABLET | Freq: Three times a day (TID) | ORAL | Status: DC | PRN

## 2013-06-20 NOTE — Progress Notes (Signed)
  This chart was scribed for Tami Lin, MD by Einar Pheasant, ED Scribe. This patient was seen in room 11 and the patient's care was started at 7:38 PM. Subjective:    Patient ID: Jesse Steele, male    DOB: November 16, 1970, 43 y.o.   MRN: [id number]  Chief Complaint  Patient presents with  . Nausea    started today  . Nausea  . Emesis  . Diarrhea    HPI HPI Comments: Jesse Steele is a 43 y.o. male who presents to [facility] complaining of nausea that started 1 day ago. Pt is also complaining of associated emesis and diarrhea. He states that he has had one sick contact with similar symptoms (His wife). Current office temperature is 100.9.    Review of Systems A complete 10 system review of systems was obtained and all systems are negative except as noted in the HPI and PMH.       Triage vitals: BP 124/80  Pulse 99  Temp(Src) 100.9 F (38.3 C) (Oral)  Resp 18  Ht 5\' 9"  (1.753 m)  Wt 226 lb (102.513 kg)  BMI 33.36 kg/m2  SpO2 98% Objective:   Physical Exam  Nursing note and vitals reviewed. Constitutional: He is oriented to person, place, and time. He appears well-developed and well-nourished. No distress.  HENT:  Head: Normocephalic and atraumatic.  Right Ear: Tympanic membrane normal.  Left Ear: Tympanic membrane normal.  Nose: Nose normal.  Mouth/Throat: Oropharynx is clear and moist. No posterior oropharyngeal erythema.  Eyes: EOM are normal.  Neck: Neck supple. No tracheal deviation present.  Cardiovascular: Normal rate and regular rhythm.   No murmur heard. Pulmonary/Chest: Effort normal. No respiratory distress. He has no wheezes. He has no rales.  Musculoskeletal: Normal range of motion.  Lymphadenopathy:    He has no cervical adenopathy.    He has no axillary adenopathy.  Neurological: He is alert and oriented to person, place, and time.  Skin: Skin is warm and dry.  Psychiatric: He has a normal mood and affect. His behavior is  normal.      Results for orders placed in visit on 06/20/13  POCT INFLUENZA A/B      Result Value Range   Influenza A, POC Negative     Influenza B, POC Negative         Assessment & Plan:  Fever - viral GI  Nausea with vomiting - Plan: ondansetron (ZOFRAN-ODT) disintegrating tablet 8 mg     I have completed the patient encounter in its entirety as documented by the scribe, with editing by me where necessary. Maddilynn Esperanza P. Laney Pastor, M.D.

## 2013-08-01 ENCOUNTER — Ambulatory Visit (INDEPENDENT_AMBULATORY_CARE_PROVIDER_SITE_OTHER): Payer: Managed Care, Other (non HMO) | Admitting: Internal Medicine

## 2013-08-01 VITALS — BP 150/93 | HR 73 | Temp 98.3°F | Resp 18 | Ht 70.0 in | Wt 230.4 lb

## 2013-08-01 DIAGNOSIS — R1031 Right lower quadrant pain: Secondary | ICD-10-CM

## 2013-08-01 DIAGNOSIS — R52 Pain, unspecified: Secondary | ICD-10-CM

## 2013-08-01 DIAGNOSIS — M545 Low back pain, unspecified: Secondary | ICD-10-CM

## 2013-08-01 DIAGNOSIS — R109 Unspecified abdominal pain: Secondary | ICD-10-CM

## 2013-08-01 LAB — POCT UA - MICROSCOPIC ONLY
BACTERIA, U MICROSCOPIC: NEGATIVE
CRYSTALS, UR, HPF, POC: NEGATIVE
Casts, Ur, LPF, POC: NEGATIVE
EPITHELIAL CELLS, URINE PER MICROSCOPY: NEGATIVE
Mucus, UA: NEGATIVE
RBC, urine, microscopic: NEGATIVE
WBC, Ur, HPF, POC: NEGATIVE
Yeast, UA: NEGATIVE

## 2013-08-01 LAB — POCT URINALYSIS DIPSTICK
Bilirubin, UA: NEGATIVE
Glucose, UA: NEGATIVE
KETONES UA: NEGATIVE
Leukocytes, UA: NEGATIVE
Nitrite, UA: NEGATIVE
PH UA: 7
PROTEIN UA: NEGATIVE
RBC UA: NEGATIVE
SPEC GRAV UA: 1.01
UROBILINOGEN UA: 0.2

## 2013-08-01 MED ORDER — HYDROCODONE-ACETAMINOPHEN 7.5-325 MG/15ML PO SOLN: 5.0000 mL | Freq: Four times a day (QID) | ORAL | Status: DC | PRN

## 2013-08-01 NOTE — Progress Notes (Signed)
   Subjective:    Patient ID: Jesse Steele, male    DOB: 1970/07/21, 43 y.o.   MRN: [id number]  HPI Patient presents today with low back spasms. He has a hx of Kidney stones. No blood in urine or urine changes. He was lifting weight a couple days ago. He is able to squat, walking has mild changes. He has pain when he tries to lay down. He has pain when he twists his back to the left. No radiation, weakness or numbness. He is better today.  Takes HC without problem  Review of Systems Esophageal stricture    Objective:   Physical Exam  Constitutional: He is oriented to person, place, and time. He appears well-developed and well-nourished. He appears distressed.  HENT:  Head: Normocephalic.  Eyes: EOM are normal.  Neck: Normal range of motion.  Cardiovascular: Normal rate, regular rhythm and normal heart sounds.   Pulmonary/Chest: Effort normal and breath sounds normal.  Abdominal: Soft. Bowel sounds are normal.  Musculoskeletal: He exhibits tenderness.       Lumbar back: He exhibits tenderness, pain and spasm. He exhibits normal range of motion, no bony tenderness, no swelling, no edema, no deformity and normal pulse.       Back:  Left back spasms with movement  Neurological: He is alert and oriented to person, place, and time. He has normal reflexes. No cranial nerve deficit. He exhibits normal muscle tone. Coordination normal.  Psychiatric: He has a normal mood and affect. His behavior is normal.   Results for orders placed in visit on 08/01/13  POCT UA - MICROSCOPIC ONLY      Result Value Ref Range   WBC, Ur, HPF, POC neg     RBC, urine, microscopic neg     Bacteria, U Microscopic neg     Mucus, UA neg     Epithelial cells, urine per micros neg     Crystals, Ur, HPF, POC neg     Casts, Ur, LPF, POC neg     Yeast, UA neg    POCT URINALYSIS DIPSTICK      Result Value Ref Range   Color, UA yellow     Clarity, UA clear     Glucose, UA neg     Bilirubin, UA neg     Ketones, UA neg     Spec Grav, UA 1.010     Blood, UA neg     pH, UA 7.0     Protein, UA neg     Urobilinogen, UA 0.2     Nitrite, UA neg     Leukocytes, UA Negative            Assessment & Plan:  LB strain Back care/Lortab elixir

## 2013-08-01 NOTE — Patient Instructions (Signed)
Get OTC Debrox ear drops, place in ear when needed.

## 2013-08-16 ENCOUNTER — Ambulatory Visit (INDEPENDENT_AMBULATORY_CARE_PROVIDER_SITE_OTHER): Payer: Managed Care, Other (non HMO) | Admitting: Family Medicine

## 2013-08-16 VITALS — BP 122/77 | HR 77 | Temp 98.5°F | Resp 16 | Ht 68.75 in | Wt 224.6 lb

## 2013-08-16 DIAGNOSIS — J019 Acute sinusitis, unspecified: Secondary | ICD-10-CM

## 2013-08-16 DIAGNOSIS — J329 Chronic sinusitis, unspecified: Secondary | ICD-10-CM

## 2013-08-16 MED ORDER — AMOXICILLIN-POT CLAVULANATE 600-42.9 MG/5ML PO SUSR: 600.0000 mg | Freq: Two times a day (BID) | ORAL | Status: DC

## 2013-08-16 NOTE — Progress Notes (Signed)
Patient ID: TRAJAN GROVE MRN: [id number], DOB: 01-01-1971, 43 y.o. Date of Encounter: 08/16/2013, 5:14 PM  Primary Physician: Kennon Portela, MD  Chief Complaint:  Chief Complaint  Patient presents with  . Sinusitis    x two weeks, green mucus  . Cough    HPI: 43 y.o. year old male presents with 10 day history of nasal congestion, post nasal drip, sore throat, sinus pressure, and cough. Afebrile. No chills. Nasal congestion thick and green/yellow. Sinus pressure is the worst symptom. Cough is productive secondary to post nasal drip and not associated with time of day. Ears feel full, leading to sensation of muffled hearing. Has tried OTC cold preps without success. No GI complaints.   No recent antibiotics, recent travels, or sick contacts   No leg trauma, sedentary periods, h/o cancer, or tobacco use.  Past Medical History  Diagnosis Date  . GERD (gastroesophageal reflux disease)   . ADHD (attention deficit hyperactivity disorder)   . Esophageal stricture   . Anxiety   . Liver lesion   . Diverticulosis   . Lumbar degenerative disc disease      Home Meds: Prior to Admission medications   Medication Sig Start Date End Date Taking? Authorizing Provider  esomeprazole (NEXIUM) 40 MG capsule Take 1 capsule (40 mg total) by mouth daily before breakfast. 10/21/12  Yes Lafayette Dragon, MD  HYDROcodone-acetaminophen (HYCET) 7.5-325 mg/15 ml solution Take 5 mLs by mouth every 6 (six) hours as needed (or cough). 08/01/13  Yes Orma Flaming, MD  methylphenidate [facility]) 10 mg/9hr Place 1 patch onto the skin daily. wear patch for 9 hours only each day   Yes Historical Provider, MD  naproxen sodium (ANAPROX) 220 MG tablet Take 220 mg by mouth daily.   Yes Historical Provider, MD  triamcinolone (NASACORT ALLERGY 24HR) 55 MCG/ACT AERO nasal inhaler Place 2 sprays into the nose daily. 04/29/13  Yes Mancel Bale, PA-C  amoxicillin-clavulanate (AUGMENTIN) 600-42.9 MG/5ML suspension Take 5  mLs (600 mg total) by mouth 2 (two) times daily. 08/16/13   Robyn Haber, MD  ondansetron (ZOFRAN) 4 MG tablet Take 1 tablet (4 mg total) by mouth every 8 (eight) hours as needed for nausea or vomiting. 06/20/13   Leandrew Koyanagi, MD    Allergies:  Allergies  Allergen Reactions  . Codeine Other (See Comments)    Reaction unknown (family allergy)  . Penicillins Nausea Only  . Sulfa Antibiotics Nausea Only    History   Social History  . Marital Status: Married    Spouse Name: N/A    Number of Children: 1  . Years of Education: N/A   Occupational History  . Sales    Social History Main Topics  . Smoking status: Never Smoker   . Smokeless tobacco: Never Used  . Alcohol Use: 1.0 oz/week    2 drink(s) per week     Comment: 3 Drinks per week  . Drug Use: No  . Sexual Activity: Yes    Partners: Female   Other Topics Concern  . Not on file   Social History Narrative   Daily caffeine      Review of Systems: Constitutional: negative for chills, fever, night sweats or weight changes Cardiovascular: negative for chest pain or palpitations Respiratory: negative for hemoptysis, wheezing, or shortness of breath Abdominal: negative for abdominal pain, nausea, vomiting or diarrhea Dermatological: negative for rash Neurologic: negative for headache   Physical Exam: Blood pressure 122/77, pulse 77, temperature 98.5  F (36.9 C), temperature source Oral, resp. rate 16, height 5' 8.75" (1.746 m), weight 224 lb 9.6 oz (101.878 kg), SpO2 97.00%., Body mass index is 33.42 kg/(m^2). General: Well developed, well nourished, in no acute distress. Head: Normocephalic, atraumatic, eyes without discharge, sclera non-icteric, nares are congested. Bilateral auditory canals clear, TM's are without perforation, pearly grey with reflective cone of light bilaterally. Serous effusion bilaterally behind TM's. Maxillary sinus TTP. Oral cavity moist, dentition normal. Posterior pharynx with post nasal  drip and mild erythema. No peritonsillar abscess or tonsillar exudate. Neck: Supple. No thyromegaly. Full ROM. No lymphadenopathy. Lungs: Clear bilaterally to auscultation without wheezes, rales, or rhonchi. Breathing is unlabored.  Heart: RRR with S1 S2. No murmurs, rubs, or gallops appreciated. Msk:  Strength and tone normal for age. Extremities: No clubbing or cyanosis. No edema. Neuro: Alert and oriented X 3. Moves all extremities spontaneously. CNII-XII grossly in tact. Psych:  Responds to questions appropriately with a normal affect.   Labs:   ASSESSMENT AND PLAN:  43 y.o. year old male with sinusitis -Sinusitis, acute - Plan: amoxicillin-clavulanate (AUGMENTIN) 600-42.9 MG/5ML suspension    -Tylenol/Motrin prn -Rest/fluids -RTC precautions -RTC 3-5 days if no improvement  Signed, Robyn Haber, MD 08/16/2013 5:14 PM

## 2013-08-16 NOTE — Patient Instructions (Signed)

## 2013-09-05 ENCOUNTER — Ambulatory Visit: Payer: Managed Care, Other (non HMO)

## 2013-09-05 ENCOUNTER — Ambulatory Visit (INDEPENDENT_AMBULATORY_CARE_PROVIDER_SITE_OTHER): Payer: Managed Care, Other (non HMO) | Admitting: Family Medicine

## 2013-09-05 VITALS — BP 116/72 | HR 82 | Temp 98.7°F | Resp 16 | Ht 68.75 in | Wt 222.6 lb

## 2013-09-05 DIAGNOSIS — F419 Anxiety disorder, unspecified: Secondary | ICD-10-CM

## 2013-09-05 DIAGNOSIS — R0789 Other chest pain: Secondary | ICD-10-CM

## 2013-09-05 DIAGNOSIS — F411 Generalized anxiety disorder: Secondary | ICD-10-CM

## 2013-09-05 NOTE — Progress Notes (Signed)
Subjective: 43 year old man who is in here with a history of having had left chest pains since Friday. It hurts mostly in the mornings. And he has not been awakened at night by it. He went to work this morning, but he told his boss he is having the pain and the boss sent him here. No nausea vomiting or reflux symptoms. No known injuries. His uncle had heart surgery 2 months ago and he just is concerned to make sure that is not his heart.  Basically considers himself healthy person. Has a history of GERD. He did have a small distal esophageal stricture on his EGD 3 years ago. He is on medicine for ADHD and a PPI  Objective: Anxious man in no acute distress. Neck supple without nodes thyromegaly. Chest clear. Heart regular without murmurs gallops or arrhythmias. No chest wall tenderness. Abdomen soft without masses or tenderness. Small lipoma right lateral abdominal wall.  Assessment:  Atypical chest pain Lipoma History of GE reflux History of ADHD Anxiety  Plan: Chest x-ray [facility] reading (PRIMARY) by  Dr. Linna Darner Normal cxr  Reassurance that everything looks good. I believe this is a noncardiac chest wall pain. Will see him back on as-needed basis.  Take naproxen 440 mg twice daily for  .

## 2013-09-05 NOTE — Patient Instructions (Addendum)
Take Aleve (naproxen) 2 pills twice daily with food and a big glass of water for about one week.  If symptoms persist get rechecked. If severe pains at anytime go to the emergency room  Continue your regular medications  Return to work tomorrow

## 2013-11-10 ENCOUNTER — Other Ambulatory Visit: Payer: Self-pay | Admitting: Internal Medicine

## 2013-11-10 NOTE — Telephone Encounter (Signed)
NEEDS OFFICE VISIT FOR ANY FURTHER REFILLS! 

## 2013-12-05 ENCOUNTER — Ambulatory Visit (INDEPENDENT_AMBULATORY_CARE_PROVIDER_SITE_OTHER): Payer: Managed Care, Other (non HMO) | Admitting: Family Medicine

## 2013-12-05 VITALS — BP 120/74 | HR 98 | Temp 99.4°F | Resp 16 | Ht 69.0 in | Wt 223.2 lb

## 2013-12-05 DIAGNOSIS — L03011 Cellulitis of right finger: Secondary | ICD-10-CM

## 2013-12-05 DIAGNOSIS — M79644 Pain in right finger(s): Secondary | ICD-10-CM

## 2013-12-05 DIAGNOSIS — M79609 Pain in unspecified limb: Secondary | ICD-10-CM

## 2013-12-05 DIAGNOSIS — IMO0002 Reserved for concepts with insufficient information to code with codable children: Secondary | ICD-10-CM

## 2013-12-05 MED ORDER — DOXYCYCLINE HYCLATE 100 MG PO CAPS: 100.0000 mg | ORAL_CAPSULE | Freq: Two times a day (BID) | ORAL | Status: DC

## 2013-12-05 NOTE — Patient Instructions (Addendum)
Clean with warm soapy water   Spoke in a couple of warm water several times daily for about 10 minutes  Return if it is getting worse  Doxycycline one twice daily, take with food, be cautioned because you may sunburn easier.  Ibuprofen for pain

## 2013-12-05 NOTE — Progress Notes (Signed)
Subjective: Patient has an infected finger there is been bothering him for a few days. He thinks he had a hangnail but got more infected.  Objective: Paronychia lateral aspect of right ring finger. Very tender, red, swollen, mildly fluctuant  Assessment:  Paronychia  Plan: I&D

## 2013-12-05 NOTE — Progress Notes (Signed)
VCO. Local anesthesia with 2% lidocaine plain. Alcohol prep. Small amount of purulence noted with moderate amount of bloody drainage. Cleaned and bandaged.  Patient tolerated well.

## 2013-12-08 LAB — WOUND CULTURE
GRAM STAIN: NONE SEEN
Gram Stain: NONE SEEN
Organism ID, Bacteria: NO GROWTH

## 2014-01-31 DIAGNOSIS — R7989 Other specified abnormal findings of blood chemistry: Secondary | ICD-10-CM | POA: Insufficient documentation

## 2014-02-13 ENCOUNTER — Ambulatory Visit (INDEPENDENT_AMBULATORY_CARE_PROVIDER_SITE_OTHER): Payer: Managed Care, Other (non HMO) | Admitting: Family Medicine

## 2014-02-13 VITALS — BP 120/80 | HR 70 | Temp 97.3°F | Resp 16 | Ht 69.25 in | Wt 222.0 lb

## 2014-02-13 DIAGNOSIS — R05 Cough: Secondary | ICD-10-CM

## 2014-02-13 DIAGNOSIS — R059 Cough, unspecified: Secondary | ICD-10-CM

## 2014-02-13 DIAGNOSIS — J018 Other acute sinusitis: Secondary | ICD-10-CM

## 2014-02-13 DIAGNOSIS — J029 Acute pharyngitis, unspecified: Secondary | ICD-10-CM

## 2014-02-13 DIAGNOSIS — J209 Acute bronchitis, unspecified: Secondary | ICD-10-CM

## 2014-02-13 MED ORDER — HYDROCODONE-HOMATROPINE 5-1.5 MG/5ML PO SYRP: 5.0000 mL | ORAL_SOLUTION | Freq: Three times a day (TID) | ORAL | Status: DC | PRN

## 2014-02-13 MED ORDER — BENZONATATE 100 MG PO CAPS: 200.0000 mg | ORAL_CAPSULE | Freq: Two times a day (BID) | ORAL | Status: DC | PRN

## 2014-02-13 MED ORDER — AZITHROMYCIN 200 MG/5ML PO SUSR: ORAL | Status: DC

## 2014-02-13 NOTE — Progress Notes (Signed)
Chief Complaint:  Chief Complaint  Patient presents with  . Cough    x 3 days  . Nasal Congestion  . Diarrhea  . Nausea    HPI: Jesse Steele is a 43 y.o. male who is here for  1 week history of sore throat and dry cough, his son has similar sxs but improving. He denies fevers or chills or rash, He has had some sinus pressure, but no ear pain. He has tried otc meds without relief. He has had nonbloody diarrhea. The Cough bothers him the most. He states he is utd on his Pertussis. His son is 47 years old. He has tried [employer] He denies CP, SOB or wheezing, has some chest congestion.   Past Medical History  Diagnosis Date  . GERD (gastroesophageal reflux disease)   . ADHD (attention deficit hyperactivity disorder)   . Esophageal stricture   . Anxiety   . Liver lesion   . Diverticulosis   . Lumbar degenerative disc disease    Past Surgical History  Procedure Laterality Date  . Umbilical hernia repair    . Tonsillectomy    . Adenoidectomy    . Esophagogastroduodenoscopy  11/03/2011    Procedure: ESOPHAGOGASTRODUODENOSCOPY (EGD);  Surgeon: Lafayette Dragon, MD;  Location: [facility];  Service: Endoscopy;  Laterality: N/A;  . Savory dilation  11/03/2011    Procedure: SAVORY DILATION;  Surgeon: Lafayette Dragon, MD;  Location: [facility];  Service: Endoscopy;  Laterality: N/A;  . Upper gastrointestinal endoscopy     History   Social History  . Marital Status: Married    Spouse Name: N/A    Number of Children: 1  . Years of Education: N/A   Occupational History  . Sales    Social History Main Topics  . Smoking status: Never Smoker   . Smokeless tobacco: Never Used  . Alcohol Use: 1.0 oz/week    2 drink(s) per week     Comment: 3 Drinks per week  . Drug Use: No  . Sexual Activity: Yes    Partners: Female   Other Topics Concern  . None   Social History Narrative   Daily caffeine    Family History  Problem Relation Age of Onset  . Colon cancer Neg Hx   .  Prostate cancer Father   . COPD Mother   . Heart disease Mother   . Heart disease Maternal Grandmother   . Kidney disease Paternal Grandmother    Allergies  Allergen Reactions  . Codeine Other (See Comments)    Reaction unknown (family allergy)  . Penicillins Nausea Only  . Sulfa Antibiotics Nausea Only   Prior to Admission medications   Medication Sig Start Date End Date Taking? Authorizing Provider  amoxicillin-clavulanate (AUGMENTIN) 600-42.9 MG/5ML suspension Take 5 mLs (600 mg total) by mouth 2 (two) times daily. 08/16/13  Yes Robyn Haber, MD  esomeprazole (NEXIUM) 40 MG capsule TAKE 1 CAPSULE (40 MG TOTAL) BY MOUTH DAILY BEFORE BREAKFAST. 11/10/13  Yes Lafayette Dragon, MD  HYDROcodone-acetaminophen (HYCET) 7.5-325 mg/15 ml solution Take 5 mLs by mouth every 6 (six) hours as needed (or cough). 08/01/13  Yes Orma Flaming, MD  naproxen sodium (ANAPROX) 220 MG tablet Take 220 mg by mouth daily.   Yes Historical Provider, MD  triamcinolone (NASACORT ALLERGY 24HR) 55 MCG/ACT AERO nasal inhaler Place 2 sprays into the nose daily. 04/29/13  Yes Mancel Bale, PA-C  doxycycline (VIBRAMYCIN) 100 MG capsule Take 1 capsule (  100 mg total) by mouth 2 (two) times daily. 12/05/13   Posey Boyer, MD  methylphenidate [facility]) 10 mg/9hr Place 1 patch onto the skin daily. wear patch for 9 hours only each day    Historical Provider, MD  ondansetron (ZOFRAN) 4 MG tablet Take 1 tablet (4 mg total) by mouth every 8 (eight) hours as needed for nausea or vomiting. 06/20/13   Leandrew Koyanagi, MD     ROS: The patient denies fevers, chills, night sweats, unintentional weight loss, chest pain, palpitations, wheezing, dyspnea on exertion, nausea, vomiting, abdominal pain, dysuria, hematuria, melena, numbness, weakness, or tingling.   All other systems have been reviewed and were otherwise negative with the exception of those mentioned in the HPI and as above.    PHYSICAL EXAM: Filed Vitals:   02/13/14 0823    BP: 120/80  Pulse: 70  Temp: 97.3 F (36.3 C)  Resp: 16   Filed Vitals:   02/13/14 0823  Height: 5' 9.25" (1.759 m)  Weight: 222 lb (100.699 kg)   Body mass index is 32.55 kg/(m^2).  General: Alert, no acute distress HEENT:  Normocephalic, atraumatic, oropharynx patent. EOMI, PERRLA. + sinus tenderness, TM nl, no exudates, unable to get throat swab Cardiovascular:  Regular rate and rhythm, no rubs murmurs or gallops.  No Carotid bruits, radial pulse intact. No pedal edema.  Respiratory: Clear to auscultation bilaterally.  No wheezes, rales, or rhonchi.  No cyanosis, no use of accessory musculature GI: No organomegaly, abdomen is soft and non-tender, positive bowel sounds.  No masses. Skin: No rashes. Neurologic: Facial musculature symmetric. Psychiatric: Patient is appropriate throughout our interaction. Lymphatic: No cervical lymphadenopathy Musculoskeletal: Gait intact.   LABS: Results for orders placed in visit on 12/05/13  WOUND CULTURE      Result Value Ref Range   Gram Stain Rare     Gram Stain WBC present-both PMN and Mononuclear     Gram Stain No Squamous Epithelial Cells Seen     Gram Stain No Organisms Seen     Organism ID, Bacteria NO GROWTH 2 DAYS       EKG/XRAY:   Primary read interpreted by Dr. Marin Comment at [facility].   ASSESSMENT/PLAN: Encounter Diagnoses  Name Primary?  . Other acute sinusitis Yes  . Sore throat   . Acute bronchitis, unspecified organism   . Cough    Rx tessalon perles and hycodan syrup OTC with cepachol Will rx z pack but he needs to be ary of overuse of abx,  Cont with allergy meds  Gross sideeffects, risk and benefits, and alternatives of medications d/w patient. Patient is aware that all medications have potential sideeffects and we are unable to predict every sideeffect or drug-drug interaction that may occur.  [name], [place], DO 02/13/2014 9:02 AM

## 2014-02-13 NOTE — Patient Instructions (Signed)

## 2014-03-20 ENCOUNTER — Other Ambulatory Visit: Payer: Self-pay | Admitting: Internal Medicine

## 2014-03-21 ENCOUNTER — Telehealth: Payer: Self-pay | Admitting: Internal Medicine

## 2014-03-21 MED ORDER — ESOMEPRAZOLE MAGNESIUM 40 MG PO CPDR: DELAYED_RELEASE_CAPSULE | ORAL | Status: DC

## 2014-03-21 NOTE — Telephone Encounter (Signed)
Rx sent. Patient has scheduled routine follow up appointment for 05/10/14 @ 8:15 am.

## 2014-04-25 DIAGNOSIS — Z87438 Personal history of other diseases of male genital organs: Secondary | ICD-10-CM | POA: Insufficient documentation

## 2014-04-26 IMAGING — US US ABDOMEN COMPLETE
1 series · 13 of 25 positions shown · non-contrast
Comparison: None.

CLINICAL DATA: Nausea.  Abdominal pain.

COMPLETE ABDOMINAL ULTRASOUND

[Series 1: us abdomen complete · 0.33mm/px · 13 of 84 slices shown]
[im 1/84]
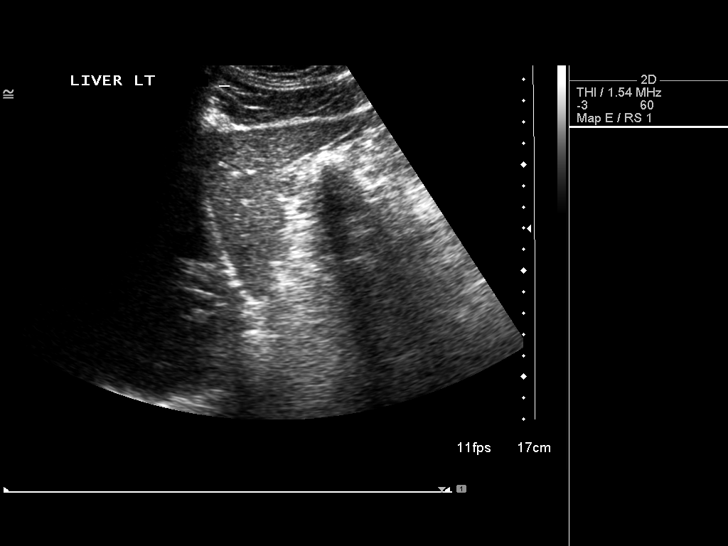
[im 7/84]
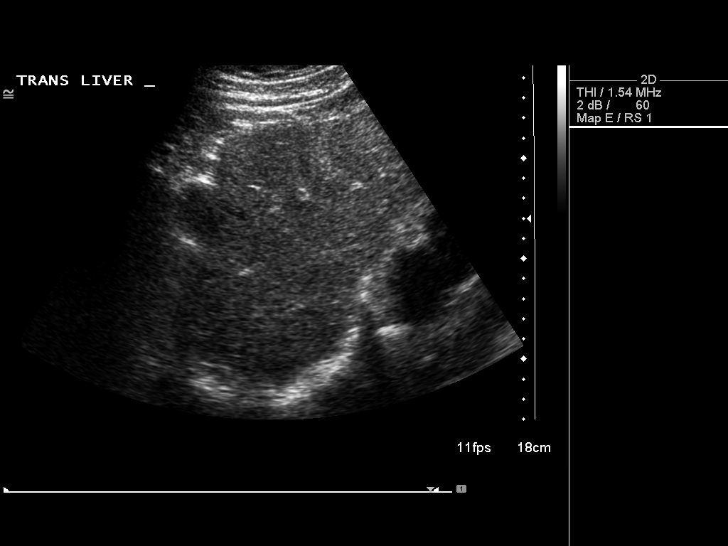
[im 14/84]
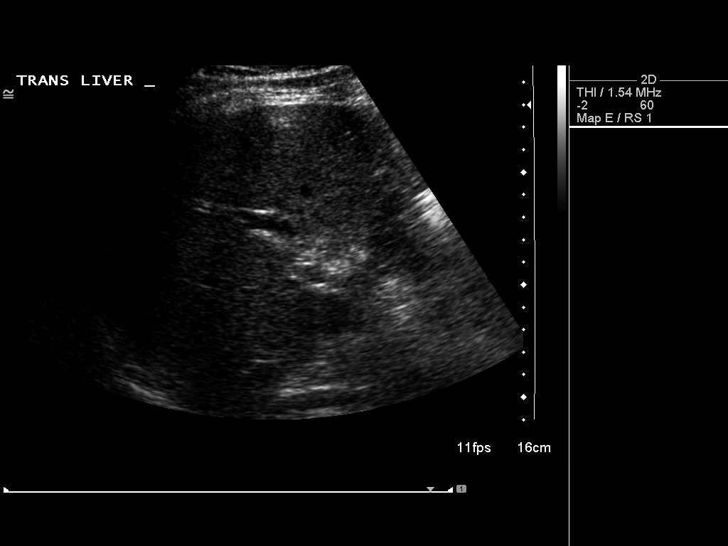
[im 21/84]
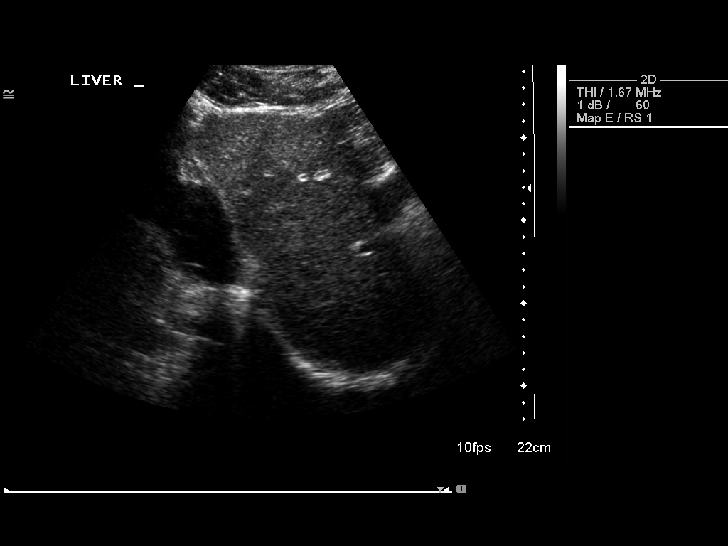
[im 28/84]
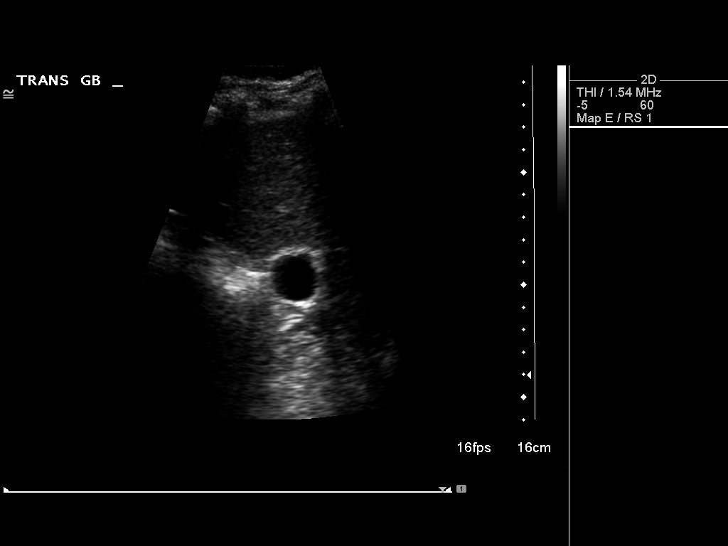
[im 35/84]
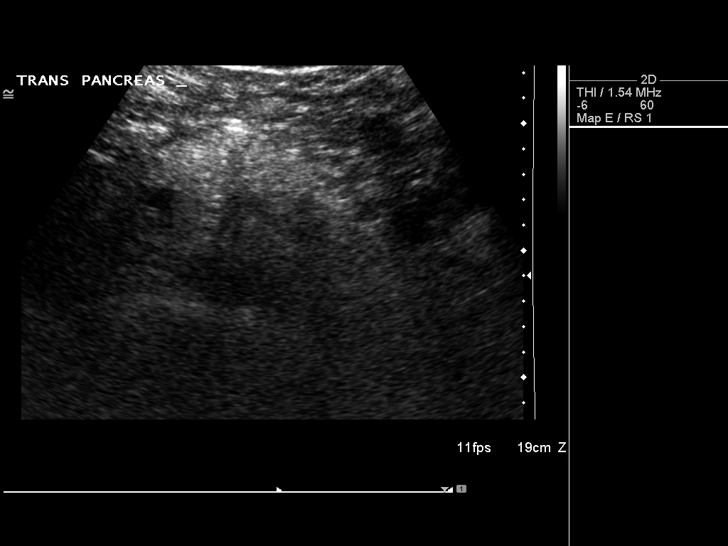
[im 42/84]
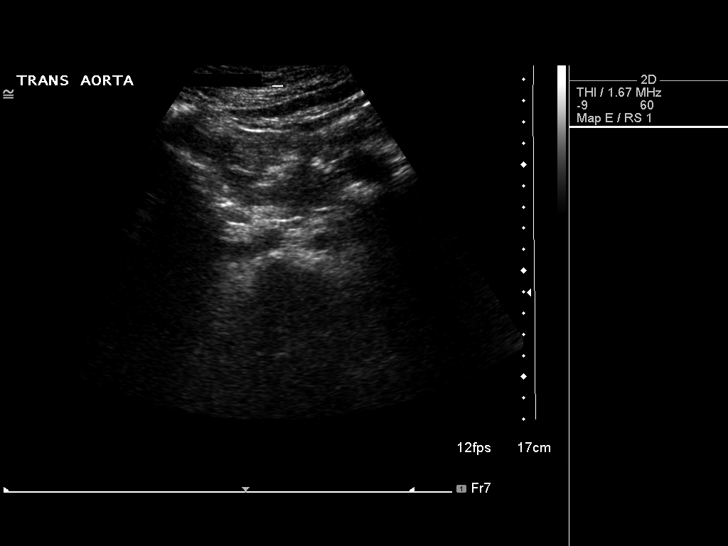
[im 49/84]
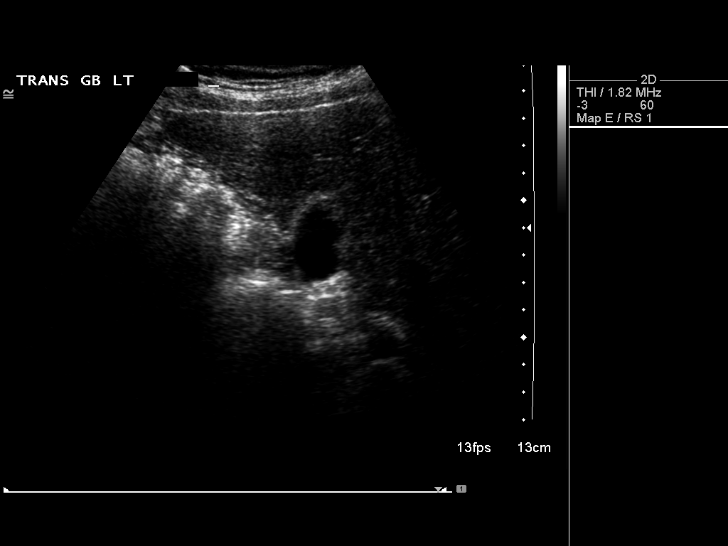
[im 56/84]
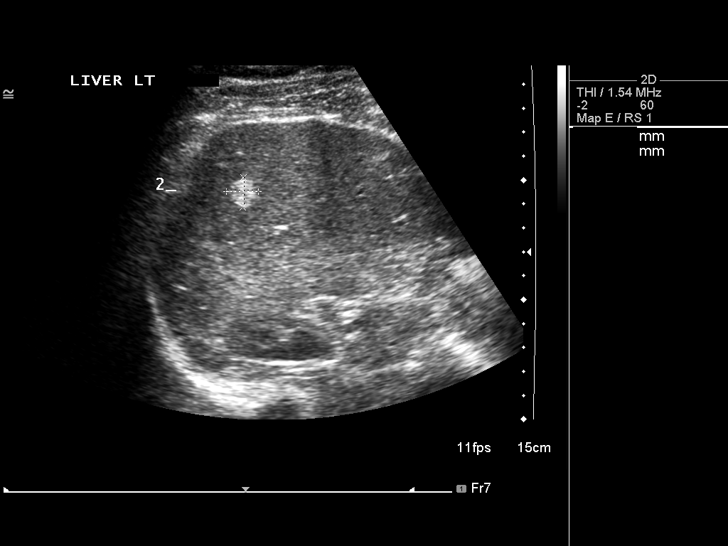
[im 63/84]
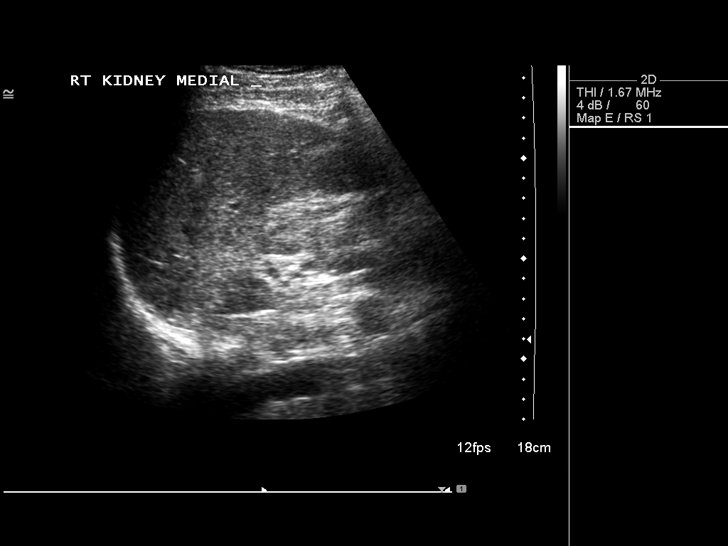
[im 70/84]
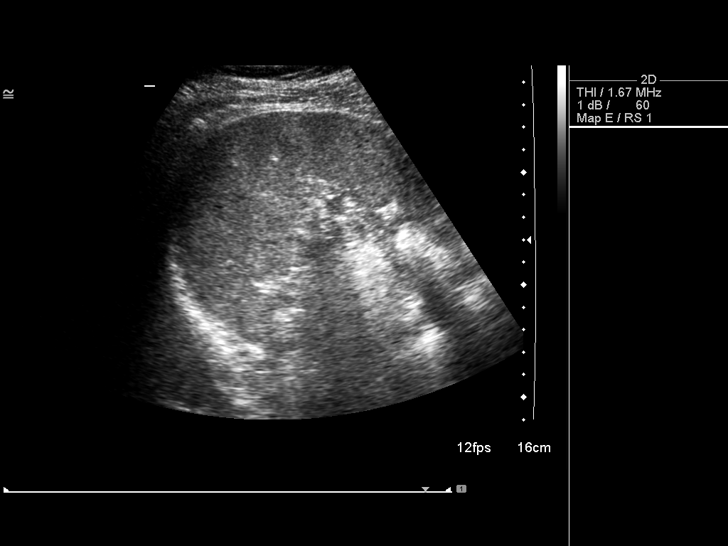
[im 77/84]
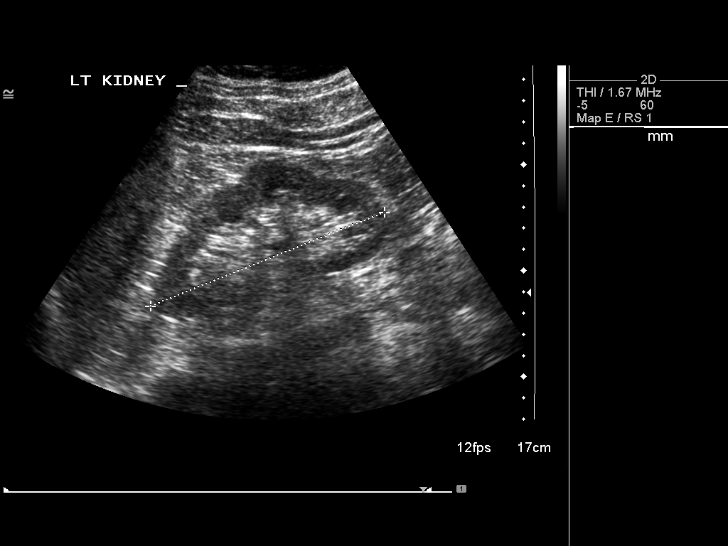
[im 84/84]
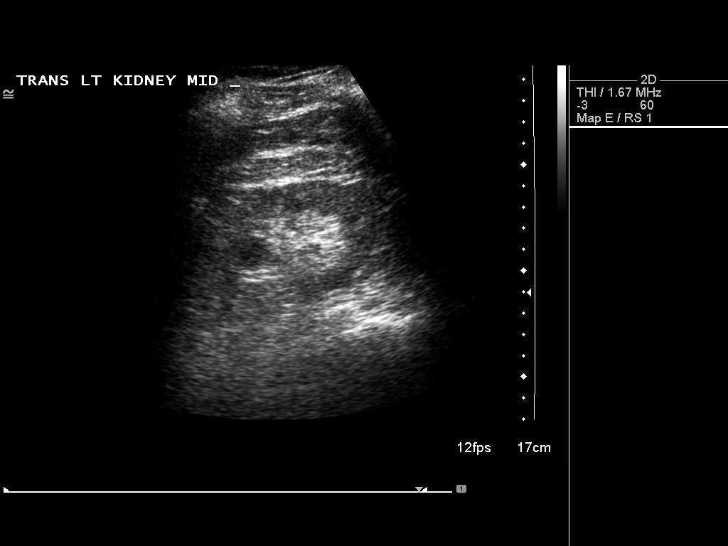

[13 of 25 positions shown; findings below may reference images not displayed]

FINDINGS: Gallbladder:  No gallstones, gallbladder wall thickening, or
pericholecystic fluid.

Common bile duct:  Measures 3 mm in diameter, within normal limits.

Liver:  A hyperechoic lesion posteriorly in the right hepatic lobe
measures 1 cm in diameter, without definite enhanced through
transmission.  A similar hyper echoic second lesion in the liver
measures 1.3 x 1.3 x 1.4 cm, without definite enhanced through
transmission.

IVC:  Poorly seen due to overlying bowel gas.

Pancreas:  Visualized portion the pancreatic body unremarkable.
Pancreatic head and tail obscured by overlying bowel gas.

Spleen:  Measures 10.2 cm craniocaudad and appears normal.

Right Kidney:  Measures 12.9 cm in length and appears normal.

Left Kidney:  Measures 11.9 cm in length and appears normal.

Abdominal aorta:  Intimal thickening and sides atherosclerosis
noted.  No aneurysm seen.
IMPRESSION: 1.  There are two hyperechoic lesions in the liver, without
definite enhanced through transmission.  The larger lesion measures
up to 1.4 cm in diameter.  The most common cause of this appearance
is benign hepatic hemangioma.  Other lesions which can cause a
similar appearance include focal nodular hyperplasia, inflammatory
pseudotumor, focal steatosis, or malignant condition such as
metastatic disease, cholangiocarcinoma, or hepatocellular
carcinoma.  Consider MRI of the abdomen with and without contrast
using dedicated hepatic dynamic protocol for further workup.
2.  Poor visualization of the IVC and portions of the pancreas due
to overlying bowel gas.
3.  Atherosclerosis.

## 2014-05-10 ENCOUNTER — Ambulatory Visit: Payer: Managed Care, Other (non HMO) | Admitting: Internal Medicine

## 2014-06-08 DIAGNOSIS — N400 Enlarged prostate without lower urinary tract symptoms: Secondary | ICD-10-CM | POA: Insufficient documentation

## 2014-06-13 ENCOUNTER — Ambulatory Visit (INDEPENDENT_AMBULATORY_CARE_PROVIDER_SITE_OTHER): Payer: BLUE CROSS/BLUE SHIELD | Admitting: Internal Medicine

## 2014-06-13 ENCOUNTER — Encounter: Payer: Self-pay | Admitting: Internal Medicine

## 2014-06-13 ENCOUNTER — Other Ambulatory Visit: Payer: Self-pay | Admitting: Internal Medicine

## 2014-06-13 VITALS — BP 120/70 | HR 68 | Ht 69.0 in | Wt 225.4 lb

## 2014-06-13 DIAGNOSIS — K7689 Other specified diseases of liver: Secondary | ICD-10-CM

## 2014-06-13 DIAGNOSIS — K219 Gastro-esophageal reflux disease without esophagitis: Secondary | ICD-10-CM

## 2014-06-13 DIAGNOSIS — K769 Liver disease, unspecified: Secondary | ICD-10-CM

## 2014-06-13 MED ORDER — ESOMEPRAZOLE MAGNESIUM 40 MG PO CPDR: DELAYED_RELEASE_CAPSULE | ORAL | Status: DC

## 2014-06-13 NOTE — Patient Instructions (Addendum)
You have been scheduled for an abdominal ultrasound at [facility] (1st floor of hospital) on 06/14/14 at 8:00am. Please arrive 15 minutes prior to your appointment for registration. Make certain not to have anything to eat or drink 6 hours prior to your appointment. Should you need to reschedule your appointment, please contact radiology at [phone]. This test typically takes about 30 minutes to perform.  We have sent the following medications to your pharmacy for you to pick up at your convenience: Nexium   I appreciate the opportunity to care for you. DR. Elder Cyphers

## 2014-06-13 NOTE — Progress Notes (Signed)
Jesse Steele [birth year] [id number]  Note: This dictation was prepared with Dragon digital system. Any transcriptional errors that result from this procedure are unintentional.   History of Present Illness: This is a 44 year old white male who comes for refill on Nexium 40 mg every morning. He has a history of gastroesophageal reflux and esophageal stricture which was dilated in June 2013 with 15 and 16 mm dilators and again in February 2014 with 18 mm dilator. He is currently completely asymptomatic on 40 mg of Nexium every morning. He denies dysphagia to any  Food other than a steak.Marland Kitchen He follows up antireflux measures. He has lost about 10 pounds. He denies nocturnal cough or hoarseness,        Past Medical History  Diagnosis Date  . GERD (gastroesophageal reflux disease)   . ADHD (attention deficit hyperactivity disorder)   . Esophageal stricture   . Anxiety   . Liver lesion   . Diverticulosis   . Lumbar degenerative disc disease     Past Surgical History  Procedure Laterality Date  . Umbilical hernia repair    . Tonsillectomy    . Adenoidectomy    . Esophagogastroduodenoscopy  11/03/2011    Procedure: ESOPHAGOGASTRODUODENOSCOPY (EGD);  Surgeon: Lafayette Dragon, MD;  Location: [facility];  Service: Endoscopy;  Laterality: N/A;  . Savory dilation  11/03/2011    Procedure: SAVORY DILATION;  Surgeon: Lafayette Dragon, MD;  Location: [facility];  Service: Endoscopy;  Laterality: N/A;  . Upper gastrointestinal endoscopy      Allergies  Allergen Reactions  . Codeine Other (See Comments)    Reaction unknown (family allergy)  . Penicillins Nausea Only  . Sulfa Antibiotics Nausea Only    Family history and social history have been reviewed.  Review of Systems: Negative for heartburn dysphagia coughing or choking  The remainder of the 10 point ROS is negative except as outlined in the H&P  Physical Exam: General Appearance Well developed, in no distress Eyes  Non icteric   HEENT  Non traumatic, normocephalic  Mouth No lesion, tongue papillated, no cheilosis Neck Supple without adenopathy, thyroid not enlarged, no carotid bruits, no JVD Lungs Clear to auscultation bilaterally COR Normal S1, normal S2, regular rhythm, no murmur, quiet precordium Abdomen protuberant. Prominent xiphoid. Normoactive bowel sounds. No tenderness Rectal not done Extremities  No pedal edema Skin No lesions Neurological Alert and oriented x 3 Psychological Normal mood and affect  Assessment and Plan:   44 year old white male with gastroesophageal reflux disease and past history of esophageal stricture currently asymptomatic after dilatation 2 years ago. We will refill Nexium 40 mg daily and he will follow antireflux measures. He will try eating steaks but chew it carefully.  Liver lesion on abdominal ultrasound and CT scan 2 years ago. Needs follow-up for stability. This was a 1.4 cm hypodense lesion. We will schedule upper abdominal ultrasound stability    Delfin Edis @TODAY (<PARAMETER> error)@

## 2014-06-14 ENCOUNTER — Ambulatory Visit (HOSPITAL_COMMUNITY): Payer: BLUE CROSS/BLUE SHIELD

## 2014-07-04 ENCOUNTER — Ambulatory Visit (HOSPITAL_COMMUNITY): Payer: BLUE CROSS/BLUE SHIELD

## 2014-07-06 ENCOUNTER — Ambulatory Visit (HOSPITAL_COMMUNITY): Payer: BLUE CROSS/BLUE SHIELD

## 2014-07-27 ENCOUNTER — Ambulatory Visit (HOSPITAL_COMMUNITY): Payer: BLUE CROSS/BLUE SHIELD

## 2014-08-14 ENCOUNTER — Ambulatory Visit (INDEPENDENT_AMBULATORY_CARE_PROVIDER_SITE_OTHER): Payer: BLUE CROSS/BLUE SHIELD | Admitting: Physician Assistant

## 2014-08-14 VITALS — BP 130/86 | HR 92 | Temp 99.7°F | Resp 20 | Ht 69.0 in | Wt 217.8 lb

## 2014-08-14 DIAGNOSIS — L299 Pruritus, unspecified: Secondary | ICD-10-CM | POA: Diagnosis not present

## 2014-08-14 DIAGNOSIS — L989 Disorder of the skin and subcutaneous tissue, unspecified: Secondary | ICD-10-CM | POA: Diagnosis not present

## 2014-08-14 LAB — POCT CBC
Granulocyte percent: 67.4 %G (ref 37–80)
HCT, POC: 46.7 % (ref 43.5–53.7)
Hemoglobin: 15.4 g/dL (ref 14.1–18.1)
Lymph, poc: 2.2 (ref 0.6–3.4)
MCH, POC: 27.9 pg (ref 27–31.2)
MCHC: 33 g/dL (ref 31.8–35.4)
MCV: 84.4 fL (ref 80–97)
MID (cbc): 0.6 (ref 0–0.9)
MPV: 7.3 fL (ref 0–99.8)
POC Granulocyte: 5.8 (ref 2–6.9)
POC LYMPH PERCENT: 25.3 %L (ref 10–50)
POC MID %: 7.3 %M (ref 0–12)
Platelet Count, POC: 209 10*3/uL (ref 142–424)
RBC: 5.53 M/uL (ref 4.69–6.13)
RDW, POC: 14 %
WBC: 8.6 10*3/uL (ref 4.6–10.2)

## 2014-08-14 MED ORDER — HYDROXYZINE HCL 25 MG PO TABS
12.5000 mg | ORAL_TABLET | Freq: Three times a day (TID) | ORAL | Status: DC | PRN
Start: 2014-08-14 — End: 2015-02-01

## 2014-08-14 MED ORDER — RANITIDINE HCL 150 MG PO TABS: 150.0000 mg | ORAL_TABLET | Freq: Two times a day (BID) | ORAL | Status: DC

## 2014-08-14 NOTE — Progress Notes (Signed)
08/14/2014 at 5:28 PM  Jesse Steele / DOB: 1971/02/16 / MRN: [id number]  The patient has ADHD (attention deficit hyperactivity disorder); Stricture and stenosis of esophagus; Other dysphagia; GERD (gastroesophageal reflux disease); and Liver lesion on his problem list.  SUBJECTIVE  Chief complaint: Insect Bite   History of present illness: Jesse Steele is 44 y.o. well appearing male presenting for a bug bite to the posterior left leg and itchy skin.  He reports going to the beach last weekend and says that the itching started Saturday. He received a spray tan on Friday and reports that he is itching everywhere where received the spray tan. He reports a small non pruritic swelling behind the left knee. He denies constitutional symptoms at this time.   He  has a past medical history of GERD (gastroesophageal reflux disease); ADHD (attention deficit hyperactivity disorder); Esophageal stricture; Anxiety; Liver lesion; Diverticulosis; and Lumbar degenerative disc disease.    He has a current medication list which includes the following prescription(s): amphetamine-dextroamphetamine, esomeprazole, hydroxyzine, ranitidine, and testosterone cypionate.  Jesse Steele is allergic to codeine; penicillins; and sulfa antibiotics. He  reports that he has never smoked. He has never used smokeless tobacco. He reports that he drinks about 1.0 oz of alcohol per week. He reports that he does not use illicit drugs. He  reports that he currently engages in sexual activity and has had male partners. The patient  has past surgical history that includes Umbilical hernia repair; Tonsillectomy; Adenoidectomy; Esophagogastroduodenoscopy (11/03/2011); Savory dilation (11/03/2011); and Upper gastrointestinal endoscopy.  His family history includes COPD in his mother; Heart disease in his maternal grandmother and mother; Kidney disease in his paternal grandmother; Prostate cancer in his father. There is no history of Colon  cancer.  Review of Systems  Constitutional: Negative for fever and chills.  Eyes: Negative.   Respiratory: Negative.   Cardiovascular: Negative.   Gastrointestinal: Negative.   Genitourinary: Negative.   Musculoskeletal: Negative for myalgias.  Skin: Positive for itching and rash.  Neurological: Negative for dizziness and headaches.    OBJECTIVE  His  height is 5\' 9"  (1.753 m) and weight is 217 lb 12.8 oz (98.793 kg). His oral temperature is 99.7 F (37.6 C). His blood pressure is 130/86 and his pulse is 92. His respiration is 20 and oxygen saturation is 96%.  The patient's body mass index is 32.15 kg/(m^2).  Physical Exam  Constitutional: He is oriented to person, place, and time. He appears well-developed and well-nourished. No distress.  Neck: Normal range of motion. Neck supple.  Cardiovascular: Normal rate and regular rhythm.   Respiratory: Effort normal and breath sounds normal.  GI: Soft. Bowel sounds are normal.  Musculoskeletal: Normal range of motion.  Lymphadenopathy:    He has no cervical adenopathy.  Neurological: He is alert and oriented to person, place, and time.  Skin: Skin is warm and dry. He is not diaphoretic. No pallor.     Psychiatric: He has a normal mood and affect.    Results for orders placed or performed in visit on 08/14/14 (from the past 24 hour(s))  POCT CBC     Status: None   Collection Time: 08/14/14  5:11 PM  Result Value Ref Range   WBC 8.6 4.6 - 10.2 K/uL   Lymph, poc 2.2 0.6 - 3.4   POC LYMPH PERCENT 25.3 10 - 50 %L   MID (cbc) 0.6 0 - 0.9   POC MID % 7.3 0 - 12 %M   POC Granulocyte  5.8 2 - 6.9   Granulocyte percent 67.4 37 - 80 %G   RBC 5.53 4.69 - 6.13 M/uL   Hemoglobin 15.4 14.1 - 18.1 g/dL   HCT, POC 46.7 43.5 - 53.7 %   MCV 84.4 80 - 97 fL   MCH, POC 27.9 27 - 31.2 pg   MCHC 33.0 31.8 - 35.4 g/dL   RDW, POC 14.0 %   Platelet Count, POC 209 142 - 424 K/uL   MPV 7.3 0 - 99.8 fL    ASSESSMENT & PLAN  Jesse Steele was seen today  for insect bite.  Diagnoses and all orders for this visit:  Pruritic dermatitis: Liley allergic 2/2 spray tan administration.  However, sun exposure could also be to blame.  CBC and PE reassuring.   Orders: -     POCT CBC -     hydrOXYzine (ATARAX/VISTARIL) 25 MG tablet; Take 0.5-1 tablets (12.5-25 mg total) by mouth every 8 (eight) hours as needed for anxiety or itching. -     ranitidine (ZANTAC) 150 MG tablet; Take 1 tablet (150 mg total) by mouth 2 (two) times daily.  Skin lesion: Most consistent with a small noninfected sebaceous cyst given discrete borders and presence of a punctum. Anticipatory guidance provided.     The patient was advised to call or come back to clinic if he does not see an improvement in symptoms, or worsens with the above plan.   Philis Fendt, MHS, PA-C [facility] 08/14/2014 5:28 PM

## 2015-02-01 ENCOUNTER — Ambulatory Visit (INDEPENDENT_AMBULATORY_CARE_PROVIDER_SITE_OTHER): Payer: BLUE CROSS/BLUE SHIELD | Admitting: Family Medicine

## 2015-02-01 ENCOUNTER — Encounter: Payer: Self-pay | Admitting: Family Medicine

## 2015-02-01 VITALS — BP 146/82 | HR 67 | Temp 99.2°F | Resp 16 | Wt 218.0 lb

## 2015-02-01 DIAGNOSIS — J209 Acute bronchitis, unspecified: Secondary | ICD-10-CM

## 2015-02-01 DIAGNOSIS — R030 Elevated blood-pressure reading, without diagnosis of hypertension: Secondary | ICD-10-CM

## 2015-02-01 DIAGNOSIS — IMO0001 Reserved for inherently not codable concepts without codable children: Secondary | ICD-10-CM

## 2015-02-01 MED ORDER — HYDROCOD POLST-CPM POLST ER 10-8 MG/5ML PO SUER: 5.0000 mL | Freq: Every evening | ORAL | Status: DC | PRN

## 2015-02-01 MED ORDER — CETIRIZINE HCL 10 MG PO TABS: 10.0000 mg | ORAL_TABLET | Freq: Every day | ORAL | Status: DC

## 2015-02-01 MED ORDER — ALBUTEROL SULFATE HFA 108 (90 BASE) MCG/ACT IN AERS: 2.0000 | INHALATION_SPRAY | RESPIRATORY_TRACT | Status: DC | PRN

## 2015-02-01 MED ORDER — ALBUTEROL SULFATE (2.5 MG/3ML) 0.083% IN NEBU: 2.5000 mg | INHALATION_SOLUTION | Freq: Once | RESPIRATORY_TRACT | Status: DC

## 2015-02-01 MED ORDER — IPRATROPIUM BROMIDE 0.02 % IN SOLN: 0.5000 mg | Freq: Once | RESPIRATORY_TRACT | Status: DC

## 2015-02-01 NOTE — Patient Instructions (Signed)
Continue with the MUCINEX-DM during the day - not the MUCINEX-D - and then start a zyrtec at night for 2 weeks. You can add on a teaspoon of cough syrup at night as needed.  Use the inhaler about 4 times a day (breakfast, lunch, dinner, before bed) until the cough is gone.  Make sure it is humid enough in your room (watch out for a/c or heat) Acute Bronchitis Bronchitis is inflammation of the airways that extend from the windpipe into the lungs (bronchi). The inflammation often causes mucus to develop. This leads to a cough, which is the most common symptom of bronchitis.  In acute bronchitis, the condition usually develops suddenly and goes away over time, usually in a couple weeks. Smoking, allergies, and asthma can make bronchitis worse. Repeated episodes of bronchitis may cause further lung problems.  CAUSES Acute bronchitis is most often caused by the same virus that causes a cold. The virus can spread from person to person (contagious) through coughing, sneezing, and touching contaminated objects. SIGNS AND SYMPTOMS   Cough.   Fever.   Coughing up mucus.   Body aches.   Chest congestion.   Chills.   Shortness of breath.   Sore throat.  DIAGNOSIS  Acute bronchitis is usually diagnosed through a physical exam. Your health care provider will also ask you questions about your medical history. Tests, such as chest X-rays, are sometimes done to rule out other conditions.  TREATMENT  Acute bronchitis usually goes away in a couple weeks. Oftentimes, no medical treatment is necessary. Medicines are sometimes given for relief of fever or cough. Antibiotic medicines are usually not needed but may be prescribed in certain situations. In some cases, an inhaler may be recommended to help reduce shortness of breath and control the cough. A cool mist vaporizer may also be used to help thin bronchial secretions and make it easier to clear the chest.  HOME CARE INSTRUCTIONS  Get plenty of  rest.   Drink enough fluids to keep your urine clear or pale yellow (unless you have a medical condition that requires fluid restriction). Increasing fluids may help thin your respiratory secretions (sputum) and reduce chest congestion, and it will prevent dehydration.   Take medicines only as directed by your health care provider.  If you were prescribed an antibiotic medicine, finish it all even if you start to feel better.  Avoid smoking and secondhand smoke. Exposure to cigarette smoke or irritating chemicals will make bronchitis worse. If you are a smoker, consider using nicotine gum or skin patches to help control withdrawal symptoms. Quitting smoking will help your lungs heal faster.   Reduce the chances of another bout of acute bronchitis by washing your hands frequently, avoiding people with cold symptoms, and trying not to touch your hands to your mouth, nose, or eyes.   Keep all follow-up visits as directed by your health care provider.  SEEK MEDICAL CARE IF: Your symptoms do not improve after 1 week of treatment.  SEEK IMMEDIATE MEDICAL CARE IF:  You develop an increased fever or chills.   You have chest pain.   You have severe shortness of breath.  You have bloody sputum.   You develop dehydration.  You faint or repeatedly feel like you are going to pass out.  You develop repeated vomiting.  You develop a severe headache. MAKE SURE YOU:   Understand these instructions.  Will watch your condition.  Will get help right away if you are not doing well or  get worse. Document Released: 06/05/2004 Document Revised: 09/12/2013 Document Reviewed: 10/19/2012 [facility] Patient Information 2015 [place], [place]. This information is not intended to replace advice given to you by your health care provider. Make sure you discuss any questions you have with your health care provider.

## 2015-02-01 NOTE — Progress Notes (Signed)
Subjective:    Patient ID: STEPFON Steele, male    DOB: Nov 05, 1970, 44 y.o.   MRN: [id number] Chief Complaint  Patient presents with  . Cough    since Tuesday  . Diarrhea    HPI  A lot of second hand smoke exposure on Monday and then around shedding dog.  Feels like he inhaled a whole bunch of dog hair - throat is really scratchy and a bad taste in his mouth he can't get rid of. Has had a dry cough and feels like something is stuck in this throat - like the dog hair.  Feels a lot of congestion and right ear bothering him. Has taken mucinex-DM but still having severe dry cough. No f/c, no sig rhinitis.  No smoking hx but parents smoked in the house when growing up and he did have asthma when little as well as sev episodes of bronchitis so has used an inhaler in the past sev times.  Diarrhea started today - often gets GI changes like this when he gets sick.  No h/o HTN - thinks it must be the mucinex DM  Past Medical History  Diagnosis Date  . GERD (gastroesophageal reflux disease)   . ADHD (attention deficit hyperactivity disorder)   . Esophageal stricture   . Anxiety   . Liver lesion   . Diverticulosis   . Lumbar degenerative disc disease    Current Outpatient Prescriptions on File Prior to Visit  Medication Sig Dispense Refill  . amphetamine-dextroamphetamine (ADDERALL XR) 20 MG 24 hr capsule Take 20 mg by mouth.    . esomeprazole (NEXIUM) 40 MG capsule TAKE 1 CAPSULE (40 MG TOTAL) BY MOUTH DAILY BEFORE BREAKFAST. 90 capsule 3  . ranitidine (ZANTAC) 150 MG tablet Take 1 tablet (150 mg total) by mouth 2 (two) times daily. (Patient not taking: Reported on 02/01/2015) 60 tablet 0   No current facility-administered medications on file prior to visit.   Allergies  Allergen Reactions  . Codeine Other (See Comments)    Reaction unknown (family allergy)  . Penicillins Nausea Only  . Sulfa Antibiotics Nausea Only     Review of Systems  Constitutional: Positive for fatigue.  Negative for fever, chills, diaphoresis, activity change, appetite change and unexpected weight change.  HENT: Positive for congestion, postnasal drip, sinus pressure, sore throat and trouble swallowing. Negative for ear pain, mouth sores, rhinorrhea and voice change.   Respiratory: Positive for cough. Negative for chest tightness, shortness of breath and wheezing.   Cardiovascular: Negative for chest pain.  Gastrointestinal: Positive for diarrhea. Negative for vomiting and constipation.  Genitourinary: Negative for dysuria.  Musculoskeletal: Negative for myalgias, arthralgias, neck pain and neck stiffness.  Skin: Negative for rash.  Neurological: Positive for headaches. Negative for syncope.  Hematological: Negative for adenopathy.  Psychiatric/Behavioral: Positive for sleep disturbance.       Objective:  BP 146/82 mmHg  Pulse 67  Temp(Src) 99.2 F (37.3 C) (Oral)  Resp 16  Wt 218 lb (98.884 kg)  Physical Exam  Constitutional: He is oriented to person, place, and time. He appears well-developed and well-nourished. No distress.  HENT:  Head: Normocephalic and atraumatic.  Right Ear: External ear and ear canal normal. Tympanic membrane is injected and retracted.  Left Ear: Tympanic membrane, external ear and ear canal normal.  Nose: Mucosal edema and rhinorrhea (Rt>Lt) present.  Mouth/Throat: Uvula is midline and mucous membranes are normal. Posterior oropharyngeal erythema present. No oropharyngeal exudate or posterior oropharyngeal edema.  +PND  Eyes: Conjunctivae are normal. No scleral icterus.  Neck: Normal range of motion. Neck supple. No thyromegaly present.  Cardiovascular: Normal rate, regular rhythm, normal heart sounds and intact distal pulses.   Pulmonary/Chest: Effort normal and breath sounds normal. No respiratory distress.  Significant improvement in air movement and forced expiration after duoneb  Abdominal: Soft.  Musculoskeletal: He exhibits no edema.    Lymphadenopathy:       Head (right side): No submandibular, no tonsillar, no preauricular and no posterior auricular adenopathy present.       Head (left side): No submandibular, no tonsillar, no preauricular and no posterior auricular adenopathy present.    He has no cervical adenopathy.       Right: No supraclavicular adenopathy present.       Left: No supraclavicular adenopathy present.  Neurological: He is alert and oriented to person, place, and time.  Skin: Skin is warm and dry. He is not diaphoretic. No erythema.  Psychiatric: He has a normal mood and affect. His behavior is normal.          Assessment & Plan:   1. Acute bronchitis, unspecified organism   sig symptomatic improvement and exam improved after duoneb in office so req qid alb for the next few days. Cont mucinex DM qam and start cetirizne qhs x [redacted] wks along w/ prn tussionex.  2, Elev BP - likely secondary to acute illness and cold meds - recheck w/in 1 mo outside of office  Meds ordered this encounter  Medications  . albuterol (PROVENTIL) (2.5 MG/3ML) 0.083% nebulizer solution 2.5 mg    Sig:   . ipratropium (ATROVENT) nebulizer solution 0.5 mg    Sig:   . albuterol (PROVENTIL HFA;VENTOLIN HFA) 108 (90 BASE) MCG/ACT inhaler    Sig: Inhale 2 puffs into the lungs every 4 (four) hours as needed for wheezing or shortness of breath (cough, shortness of breath or wheezing.).    Dispense:  1 Inhaler    Refill:  1  . chlorpheniramine-HYDROcodone (TUSSIONEX PENNKINETIC ER) 10-8 MG/5ML SUER    Sig: Take 5 mLs by mouth at bedtime as needed for cough.    Dispense:  90 mL    Refill:  0  . cetirizine (ZYRTEC) 10 MG tablet    Sig: Take 1 tablet (10 mg total) by mouth at bedtime.    Dispense:  30 tablet    Refill:  11    Delman Cheadle, MD MPH

## 2015-03-20 ENCOUNTER — Ambulatory Visit (INDEPENDENT_AMBULATORY_CARE_PROVIDER_SITE_OTHER): Payer: BLUE CROSS/BLUE SHIELD

## 2015-03-20 ENCOUNTER — Ambulatory Visit (INDEPENDENT_AMBULATORY_CARE_PROVIDER_SITE_OTHER): Payer: BLUE CROSS/BLUE SHIELD | Admitting: Family Medicine

## 2015-03-20 VITALS — BP 130/82 | HR 101 | Temp 98.3°F | Resp 18 | Ht 70.0 in | Wt 225.4 lb

## 2015-03-20 DIAGNOSIS — K5901 Slow transit constipation: Secondary | ICD-10-CM

## 2015-03-20 DIAGNOSIS — R109 Unspecified abdominal pain: Secondary | ICD-10-CM | POA: Diagnosis not present

## 2015-03-20 DIAGNOSIS — R1084 Generalized abdominal pain: Secondary | ICD-10-CM

## 2015-03-20 LAB — POCT CBC
GRANULOCYTE PERCENT: 80.7 % — AB (ref 37–80)
HEMATOCRIT: 47.6 % (ref 43.5–53.7)
Hemoglobin: 16 g/dL (ref 14.1–18.1)
LYMPH, POC: 1.6 (ref 0.6–3.4)
MCH, POC: 29.2 pg (ref 27–31.2)
MCHC: 33.6 g/dL (ref 31.8–35.4)
MCV: 86.9 fL (ref 80–97)
MID (CBC): 0.3 (ref 0–0.9)
MPV: 7.2 fL (ref 0–99.8)
POC GRANULOCYTE: 7.9 — AB (ref 2–6.9)
POC LYMPH %: 16.3 % (ref 10–50)
POC MID %: 3 % (ref 0–12)
Platelet Count, POC: 221 10*3/uL (ref 142–424)
RBC: 5.48 M/uL (ref 4.69–6.13)
RDW, POC: 13.5 %
WBC: 9.8 10*3/uL (ref 4.6–10.2)

## 2015-03-20 LAB — COMPLETE METABOLIC PANEL WITH GFR
ALT: 26 U/L (ref 9–46)
AST: 20 U/L (ref 10–40)
Albumin: 4.7 g/dL (ref 3.6–5.1)
Alkaline Phosphatase: 64 U/L (ref 40–115)
BUN: 9 mg/dL (ref 7–25)
CALCIUM: 9.4 mg/dL (ref 8.6–10.3)
CHLORIDE: 101 mmol/L (ref 98–110)
CO2: 24 mmol/L (ref 20–31)
Creat: 0.89 mg/dL (ref 0.60–1.35)
GFR, Est African American: >89 mL/min (ref >=60)
GFR, Est Non African American: >89 mL/min (ref >=60)
Glucose, Bld: 82 mg/dL (ref 65–99)
POTASSIUM: 4.1 mmol/L (ref 3.5–5.3)
SODIUM: 138 mmol/L (ref 135–146)
Total Bilirubin: 0.4 mg/dL (ref 0.2–1.2)
Total Protein: 6.8 g/dL (ref 6.1–8.1)

## 2015-03-20 LAB — POC MICROSCOPIC URINALYSIS (UMFC): MUCUS RE: ABSENT

## 2015-03-20 LAB — POCT URINALYSIS DIP (MANUAL ENTRY)
BILIRUBIN UA: NEGATIVE
Glucose, UA: NEGATIVE
Ketones, POC UA: NEGATIVE
Leukocytes, UA: NEGATIVE
NITRITE UA: NEGATIVE
PH UA: 6
PROTEIN UA: NEGATIVE
RBC UA: NEGATIVE
Spec Grav, UA: <=1.005
UROBILINOGEN UA: 0.2

## 2015-03-20 NOTE — Patient Instructions (Signed)
Drink lots of fluids  Eat lots of fruits and vegetables  Try to get regular exercise  Miralax once or twice daily until your bowels are on the loose side, then decrease to every couple of days or a half a dose daily. When you have been moving well and symptoms are gone you can stop using this and just use it on an as-needed basis.  If he keeps having symptoms we will need to do additional testing or   If worse at any time return or go to the emergency room. If not improved by 5-7 days please return.  Constipation, Adult Constipation is when a person has fewer than three bowel movements a week, has difficulty having a bowel movement, or has stools that are dry, hard, or larger than normal. As people grow older, constipation is more common. A low-fiber diet, not taking in enough fluids, and taking certain medicines may make constipation worse.  CAUSES   Certain medicines, such as antidepressants, pain medicine, iron supplements, antacids, and water pills.   Certain diseases, such as diabetes, irritable bowel syndrome (IBS), thyroid disease, or depression.   Not drinking enough water.   Not eating enough fiber-rich foods.   Stress or travel.   Lack of physical activity or exercise.   Ignoring the urge to have a bowel movement.   Using laxatives too much.  SIGNS AND SYMPTOMS   Having fewer than three bowel movements a week.   Straining to have a bowel movement.   Having stools that are hard, dry, or larger than normal.   Feeling full or bloated.   Pain in the lower abdomen.   Not feeling relief after having a bowel movement.  DIAGNOSIS  Your health care provider will take a medical history and perform a physical exam. Further testing may be done for severe constipation. Some tests may include:  A barium enema X-ray to examine your rectum, colon, and, sometimes, your small intestine.   A sigmoidoscopy to examine your lower colon.   A colonoscopy to  examine your entire colon. TREATMENT  Treatment will depend on the severity of your constipation and what is causing it. Some dietary treatments include drinking more fluids and eating more fiber-rich foods. Lifestyle treatments may include regular exercise. If these diet and lifestyle recommendations do not help, your health care provider may recommend taking over-the-counter laxative medicines to help you have bowel movements. Prescription medicines may be prescribed if over-the-counter medicines do not work.  HOME CARE INSTRUCTIONS   Eat foods that have a lot of fiber, such as fruits, vegetables, whole grains, and beans.  Limit foods high in fat and processed sugars, such as french fries, hamburgers, cookies, candies, and soda.   A fiber supplement may be added to your diet if you cannot get enough fiber from foods.   Drink enough fluids to keep your urine clear or pale yellow.   Exercise regularly or as directed by your health care provider.   Go to the restroom when you have the urge to go. Do not hold it.   Only take over-the-counter or prescription medicines as directed by your health care provider. Do not take other medicines for constipation without talking to your health care provider first.  [place] IF:   You have bright red blood in your stool.   Your constipation lasts for more than 4 days or gets worse.   You have abdominal or rectal pain.   You have thin, pencil-like stools.  You have unexplained weight loss. MAKE SURE YOU:   Understand these instructions.  Will watch your condition.  Will get help right away if you are not doing well or get worse.   This information is not intended to replace advice given to you by your health care provider. Make sure you discuss any questions you have with your health care provider.   Document Released: 01/25/2004 Document Revised: 05/19/2014 Document Reviewed: 02/07/2013 [employer] Interactive  Patient Education [employer].

## 2015-03-20 NOTE — Progress Notes (Signed)
Patient ID: Jesse Steele, Jesse Steele    DOB: 04-03-1971  Age: 44 y.o. MRN: [id number]  Chief Complaint  Patient presents with  . Abdominal Pain    4 days    Subjective:   44 year old Jesse Steele who comes in with history of having abdominal pain since last week. On the weekdays and on weekends. He has a generalized abdominal pain mostly on the right lateral side of the abdomen. No other major symptoms. Early in the weekend he vomited one time. He says one day he had a temperature 100.2. He also had a little bit of loose stools, but he had a normal bowel movement today with a good BM. Has not seen any blood. He was able to work yesterday and today despite the pains. It seems the be more discovered when he is around, less when he is sedentary. His only abdominal surgery was unable to repair. He has had some pain in the umbilicus area. He is married. He has been able to eat. He does have a history of a cyst or lesion of some sort on his liver which was insignificant.  He has been coughing a lot lately  Current allergies, medications, problem list, past/family and social histories reviewed.  Objective:  BP 130/82 mmHg  Pulse 101  Temp(Src) 98.3 F (36.8 C) (Oral)  Resp 18  Ht 5\' 10"  (1.778 m)  Wt 225 lb 6.4 oz (102.241 kg)  BMI 32.34 kg/m2  SpO2 97%  No major acute distress. Chest is clear. Heart regular without murmurs. Abdomen has normal bowel sounds. Slightly distended. Abdomen is soft little full on palpation but no major masses. No point specific tenderness though he has tenderness in the low mid abdomen and points to the right lateral abdomen is area of most pain though that didn't seem to be that tender right at this moment.  On the dorsum of his right foot he has a partially resolved scarred area where he had a wart, off 2 weeks ago. I reassured him that this looks like it is going to heal up fine.  Assessment & Plan:   Assessment: 1. Abdominal pain, generalized   2. Abdominal pain, right  lateral   3. Slow transit constipation      Plan: Check labs including CBC, urinalysis, cmp and 3 view abdomen.  Orders Placed This Encounter  Procedures  . DG Abd 2 Views    Standing Status: Future     Number of Occurrences: 1     Standing Expiration Date: 03/19/2016    Order Specific Question:  Reason for Exam (SYMPTOM  OR DIAGNOSIS REQUIRED)    Answer:  generalized abdominal pain, most in low and right side of abdomen    Order Specific Question:  Preferred imaging location?    Answer:  External  . COMPLETE METABOLIC PANEL WITH GFR  . POCT CBC  . POCT Microscopic Urinalysis ([facility])  . POCT urinalysis dipstick    [facility] reading (PRIMARY) by  Dr. Linna Darner Nonspecific abdomen, high stool burden  Results for orders placed or performed in visit on 03/20/15  POCT CBC  Result Value Ref Range   WBC 9.8 4.6 - 10.2 K/uL   Lymph, poc 1.6 0.6 - 3.4   POC LYMPH PERCENT 16.3 10 - 50 %L   MID (cbc) 0.3 0 - 0.9   POC MID % 3.0 0 - 12 %M   POC Granulocyte 7.9 (A) 2 - 6.9   Granulocyte percent 80.7 (A) 37 - 80 %G  RBC 5.48 4.69 - 6.13 M/uL   Hemoglobin 16.0 14.1 - 18.1 g/dL   HCT, POC 47.6 43.5 - 53.7 %   MCV 86.9 80 - 97 fL   MCH, POC 29.2 27 - 31.2 pg   MCHC 33.6 31.8 - 35.4 g/dL   RDW, POC 13.5 %   Platelet Count, POC 221 142 - 424 K/uL   MPV 7.2 0 - 99.8 fL  POCT Microscopic Urinalysis ([facility])  Result Value Ref Range   WBC,UR,HPF,POC None None WBC/hpf   RBC,UR,HPF,POC None None RBC/hpf   Bacteria None None, Too numerous to count   Mucus Absent Absent   Epithelial Cells, UR Per Microscopy Few (A) None, Too numerous to count cells/hpf  POCT urinalysis dipstick  Result Value Ref Range   Color, UA yellow yellow   Clarity, UA clear clear   Glucose, UA negative negative   Bilirubin, UA negative negative   Ketones, POC UA negative negative   Spec Grav, UA <=1.005    Blood, UA negative negative   pH, UA 6.0    Protein Ur, POC negative negative   Urobilinogen, UA 0.2    Nitrite,  UA Negative Negative   Leukocytes, UA Negative Negative   .      Patient Instructions  Drink lots of fluids  Eat lots of fruits and vegetables  Try to get regular exercise  Miralax once or twice daily until your bowels are on the loose side, then decrease to every couple of days or a half a dose daily. When you have been moving well and symptoms are gone you can stop using this and just use it on an as-needed basis.  If he keeps having symptoms we will need to do additional testing or   If worse at any time return or go to the emergency room. If not improved by 5-7 days please return.  Constipation, Adult Constipation is when a person has fewer than three bowel movements a week, has difficulty having a bowel movement, or has stools that are dry, hard, or larger than normal. As people grow older, constipation is more common. A low-fiber diet, not taking in enough fluids, and taking certain medicines may make constipation worse.  CAUSES   Certain medicines, such as antidepressants, pain medicine, iron supplements, antacids, and water pills.   Certain diseases, such as diabetes, irritable bowel syndrome (IBS), thyroid disease, or depression.   Not drinking enough water.   Not eating enough fiber-rich foods.   Stress or travel.   Lack of physical activity or exercise.   Ignoring the urge to have a bowel movement.   Using laxatives too much.  SIGNS AND SYMPTOMS   Having fewer than three bowel movements a week.   Straining to have a bowel movement.   Having stools that are hard, dry, or larger than normal.   Feeling full or bloated.   Pain in the lower abdomen.   Not feeling relief after having a bowel movement.  DIAGNOSIS  Your health care provider will take a medical history and perform a physical exam. Further testing may be done for severe constipation. Some tests may include:  A barium enema X-ray to examine your rectum, colon, and, sometimes,  your small intestine.   A sigmoidoscopy to examine your lower colon.   A colonoscopy to examine your entire colon. TREATMENT  Treatment will depend on the severity of your constipation and what is causing it. Some dietary treatments include drinking more fluids and eating more fiber-rich  foods. Lifestyle treatments may include regular exercise. If these diet and lifestyle recommendations do not help, your health care provider may recommend taking over-the-counter laxative medicines to help you have bowel movements. Prescription medicines may be prescribed if over-the-counter medicines do not work.  HOME CARE INSTRUCTIONS   Eat foods that have a lot of fiber, such as fruits, vegetables, whole grains, and beans.  Limit foods high in fat and processed sugars, such as french fries, hamburgers, cookies, candies, and soda.   A fiber supplement may be added to your diet if you cannot get enough fiber from foods.   Drink enough fluids to keep your urine clear or pale yellow.   Exercise regularly or as directed by your health care provider.   Go to the restroom when you have the urge to go. Do not hold it.   Only take over-the-counter or prescription medicines as directed by your health care provider. Do not take other medicines for constipation without talking to your health care provider first.  [place] IF:   You have bright red blood in your stool.   Your constipation lasts for more than 4 days or gets worse.   You have abdominal or rectal pain.   You have thin, pencil-like stools.   You have unexplained weight loss. MAKE SURE YOU:   Understand these instructions.  Will watch your condition.  Will get help right away if you are not doing well or get worse.   This information is not intended to replace advice given to you by your health care provider. Make sure you discuss any questions you have with your health care provider.   Document Released:  01/25/2004 Document Revised: 05/19/2014 Document Reviewed: 02/07/2013 Elsevier Interactive Patient Education [employer].      Return if symptoms worsen or fail to improve.   Jesse Marchi, MD 03/20/2015

## 2015-05-14 ENCOUNTER — Encounter: Payer: Self-pay | Admitting: Family Medicine

## 2015-05-14 ENCOUNTER — Ambulatory Visit (INDEPENDENT_AMBULATORY_CARE_PROVIDER_SITE_OTHER): Payer: BLUE CROSS/BLUE SHIELD | Admitting: Family Medicine

## 2015-05-14 VITALS — BP 130/74 | HR 88 | Temp 99.1°F | Resp 20 | Ht 68.5 in | Wt 222.0 lb

## 2015-05-14 DIAGNOSIS — M7631 Iliotibial band syndrome, right leg: Secondary | ICD-10-CM | POA: Diagnosis not present

## 2015-05-14 DIAGNOSIS — J0101 Acute recurrent maxillary sinusitis: Secondary | ICD-10-CM

## 2015-05-14 MED ORDER — AZITHROMYCIN 200 MG/5ML PO SUSR: 500.0000 mg | Freq: Every day | ORAL | Status: DC

## 2015-05-14 NOTE — Patient Instructions (Signed)
Iliotibial Band Syndrome With Rehab The iliotibial (IT) band is a tendon that connects the hip muscles to the shinbone (tibia) and to one of the bones of the pelvis (ileum). The IT band passes by the knee and is often irritated by the outer portion of the knee (lateral femoral condyle). A fluid filled sac (bursa) exists between the tendon and the bone, to cushion and reduce friction. Overuse of the tendon may cause excessive friction, which results in IT band syndrome. This condition involves inflammation of the bursa (bursitis) and/or inflammation of the IT band (tendinitis). SYMPTOMS   Pain, tenderness, swelling, warmth, or redness over the IT band, at the outer knee (above the joint).  Pain that travels up or down the thigh or leg.  Initially, pain at the beginning of an exercise, that decreases once warmed up. Eventually, pain throughout the activity, getting worse as the activity continues. May cause the athlete to stop in the middle of training or competing.  Pain that gets worse when running down hills or stairs, on banked tracks, or next to the curb on the street.  Pain that increases when the foot of the affected leg hits the ground.  Possibly, a crackling sound (crepitation) when the tendon or bursa is moved or touched. CAUSES  IT band syndrome is caused by irritation of the IT band and the underlying bursa. This eventually results in inflammation and pain. IT band syndrome is an overuse injury.  RISK INCREASES WITH:  Sports with repetitive knee-bending activities (distance running, cycling).  Incorrect training techniques, including sudden changes in the intensity, frequency, or duration of training.  Not enough rest between workouts.  Poor strength and flexibility, especially a tight IT band.  Failure to warm up properly before activity.  Bow legs.  Arthritis of the knee. PREVENTION   Warm up and stretch properly before activity.  Allow for adequate recovery between  workouts.  Maintain physical fitness:  Strength, flexibility, and endurance.  Cardiovascular fitness.  Learn and use proper training technique, including reducing running mileage, shortening stride, and avoiding running on hills and banked surfaces.  Wear arch supports (orthotics), if you have flat feet. PROGNOSIS  If treated properly, IT band syndrome usually goes away within 6 weeks of treatment. RELATED COMPLICATIONS   Longer healing time, if not properly treated, or if not given enough time to heal.  Recurring inflammation of the tendon and bursa, that may result in a chronic condition.  Recurring symptoms, if activity is resumed too soon, with overuse, with a direct blow, or with poor training technique.  Inability to complete training or competition. TREATMENT  Treatment first involves the use of ice and medicine, to reduce pain and inflammation. The use of strengthening and stretching exercises may help reduce pain with activity. These exercises may be performed at home or with a therapist. For individuals with flat feet, an arch support (orthotic) may be helpful. Some individuals find that wearing a knee sleeve or compression bandage around the knee during workouts provides some relief. Certain training techniques, such as adjusting stride length, avoiding running on hills or stairs, changing the direction you run on a circular or banked track, or changing the side of the road you run on, if you run next to the curb, may help decrease symptoms of IT band syndrome. Cyclists may need to change the seat height or foot position on their bicycles. An injection of cortisone into the bursa may be recommended. Surgery to remove the inflamed bursa and/or part   of the IT band is only considered after at least 6 months of non-surgical treatment.  MEDICATION   If pain medicine is needed, nonsteroidal anti-inflammatory medicines (aspirin and ibuprofen), or other minor pain relievers  (acetaminophen), are often advised.  Do not take pain medicine for 7 days before surgery.  Prescription pain relievers may be given, if your caregiver thinks they are needed. Use only as directed and only as much as you need.  Corticosteroid injections may be given by your caregiver. These injections should be reserved for the most serious cases, because they may only be given a certain number of times. HEAT AND COLD  Cold treatment (icing) should be applied for 10 to 15 minutes every 2 to 3 hours for inflammation and pain, and immediately after activity that aggravates your symptoms. Use ice packs or an ice massage.  Heat treatment may be used before performing stretching and strengthening activities prescribed by your caregiver, physical therapist, or athletic trainer. Use a heat pack or a warm water soak. SEEK MEDICAL CARE IF:   Symptoms get worse or do not improve in 2 to 4 weeks, despite treatment.  New, unexplained symptoms develop. (Drugs used in treatment may produce side effects.) EXERCISES  RANGE OF MOTION (ROM) AND STRETCHING EXERCISES - Iliotibial Band Syndrome These exercises may help you when beginning to rehabilitate your injury. Your symptoms may go away with or without further involvement from your physician, physical therapist or athletic trainer. While completing these exercises, remember:   Restoring tissue flexibility helps normal motion to return to the joints. This allows healthier, less painful movement and activity.  An effective stretch should be held for at least 30 seconds.  A stretch should never be painful. You should only feel a gentle lengthening or release in the stretched tissue. STRETCH - Quadriceps, Prone   Lie on your stomach on a firm surface, such as a bed or padded floor.  Bend your right / left knee and grasp your ankle. If you are unable to reach your ankle or pant leg, use a belt around your foot to lengthen your reach.  Gently pull your heel  toward your buttocks. Your knee should not slide out to the side. You should feel a stretch in the front of your thigh and knee.  Hold this position for __________ seconds. Repeat __________ times. Complete this stretch __________ times per day.  STRETCH - Iliotibial Band  On the floor or bed, lie on your side, so your right / left leg is on top. Bend your knee and grab your ankle.  Slowly bring your knee back so that your thigh is in line with your trunk. Keep your heel at your buttocks and gently arch your back, so your head, shoulders and hips line up.  Slowly lower your leg so that your knee approaches the floor or bed, until you feel a gentle stretch on the outside of your right / left thigh. If you do not feel a stretch and your knee will not fall farther, place the heel of your opposite foot on top of your knee, and pull your thigh down farther.  Hold this stretch for __________ seconds. Repeat __________ times. Complete this stretch __________ times per day. STRENGTHENING EXERCISES - Iliotibial Band Syndrome Improving the flexibility of the IT band will best relieve your discomfort due to IT band syndrome. Strengthening exercises, however, can help improve both muscle endurance and joint mechanics, reducing the factors that can contribute to this condition. Your physician, physical   therapist or athletic trainer may provide you with exercises that train specific muscle groups that are especially weak. The following exercises target muscles that are often weak in people who have IT band syndrome. STRENGTH - Hip Abductors, Straight Leg Raises  Be aware of your form throughout the entire exercise, so that you exercise the correct muscles. Poor form means that you are not strengthening the correct muscles.  Lie on your side, so that your head, shoulders, knee and hip line up. You may bend your lower knee to help maintain your balance. Your right / left leg should be on top.  Roll your hips  slightly forward, so that your hips are stacked directly over each other and your right / left knee is facing forward.  Lift your top leg up 4-6 inches, leading with your heel. Be sure that your foot does not drift forward and that your knee does not roll toward the ceiling.  Hold this position for __________ seconds. You should feel the muscles in your outer hip lifting (you may not notice this until your leg begins to tire).  Slowly lower your leg to the starting position. Allow the muscles to fully relax before beginning the next repetition. Repeat __________ times. Complete this exercise __________ times per day.  STRENGTH - Quad/VMO, Isometric  Sit in a chair with your right / left knee slightly bent. With your fingertips, feel the VMO muscle (just above the inside of your knee). The VMO is important in controlling the position of your kneecap.  Keeping your fingertips on this muscle. Without actually moving your leg, attempt to drive your knee down, as if straightening your leg. You should feel your VMO tense. If you have a difficult time, you may wish to try the same exercise on your healthy knee first.  Tense this muscle as hard as you can, without increasing any knee pain.  Hold for __________ seconds. Relax the muscles slowly and completely between each repetition. Repeat __________ times. Complete this exercise __________ times per day.    This information is not intended to replace advice given to you by your health care provider. Make sure you discuss any questions you have with your health care provider.   Document Released: 04/28/2005 Document Revised: 05/19/2014 Document Reviewed: 08/10/2008 Elsevier Interactive Patient Education 2016 Elsevier Inc.  

## 2015-05-14 NOTE — Progress Notes (Signed)
Subjective:    Patient ID: SRUJAN VALERA, male    DOB: 1970-12-18, 45 y.o.   MRN: [id number]  05/14/2015  URI   HPI This 45 y.o. male presents for evaluation of cold symptoms. Onset 2.5 weeks ago.    Low grade fever Tmax 101 on 05/06/15.  +chills/sweats.  +HA.  No ear pain or sore throat.  +nasal congestion yellow and thick.  Sinus pressure onset 05/06/15.  Coughing; +sputum yellow.  No SOB; no wheezing.  No v/d.  Taking Flonase, Mucinex Mint, Theraflu. No tobacco.  No asthma.  Sales.  Several coworkers with similar symptoms.   Last dose of medication last night.  R thigh pain: pulled muscle in leg in past two weeks while working out doing squats.  Working out and pulled muscle lateral tight slightly posterior.  Squats while pulling muscle.  +lower back pain mild.  Denies n/t/w in RLE.  Denies saddle paresthesias; denies b/b dysfunction.  "Figured while I am here I could mention this to you"  Severity 2/10.   Wart: still has scar on R foot where wart removed.   Review of Systems  Constitutional: Positive for fever, chills, diaphoresis and fatigue.  HENT: Positive for congestion, postnasal drip, rhinorrhea, sinus pressure and voice change. Negative for ear pain, sore throat and trouble swallowing.   Respiratory: Positive for cough. Negative for shortness of breath, wheezing and stridor.   Gastrointestinal: Negative for nausea, vomiting, abdominal pain and diarrhea.  Musculoskeletal: Positive for myalgias and back pain. Negative for joint swelling, arthralgias, gait problem, neck pain and neck stiffness.  Skin: Positive for color change. Negative for pallor, rash and wound.  Neurological: Positive for headaches.    Past Medical History  Diagnosis Date  . GERD (gastroesophageal reflux disease)   . ADHD (attention deficit hyperactivity disorder)   . Esophageal stricture   . Anxiety   . Liver lesion   . Diverticulosis   . Lumbar degenerative disc disease   . Low testosterone    Past  Surgical History  Procedure Laterality Date  . Umbilical hernia repair    . Tonsillectomy    . Adenoidectomy    . Esophagogastroduodenoscopy  11/03/2011    Procedure: ESOPHAGOGASTRODUODENOSCOPY (EGD);  Surgeon: Lafayette Dragon, MD;  Location: [facility];  Service: Endoscopy;  Laterality: N/A;  . Savory dilation  11/03/2011    Procedure: SAVORY DILATION;  Surgeon: Lafayette Dragon, MD;  Location: [facility];  Service: Endoscopy;  Laterality: N/A;  . Upper gastrointestinal endoscopy     Allergies  Allergen Reactions  . Codeine Other (See Comments)    Reaction unknown (family allergy)  . Penicillins Nausea Only  . Sulfa Antibiotics Nausea Only    Social History   Social History  . Marital Status: Married    Spouse Name: N/A  . Number of Children: 1  . Years of Education: N/A   Occupational History  . Sales    Social History Main Topics  . Smoking status: Never Smoker   . Smokeless tobacco: Never Used  . Alcohol Use: 1.0 oz/week    2 Standard drinks or equivalent per week     Comment: 3 Drinks per week  . Drug Use: No  . Sexual Activity:    Partners: Female   Other Topics Concern  . Not on file   Social History Narrative   Daily caffeine    Family History  Problem Relation Age of Onset  . Colon cancer Neg Hx   . Prostate cancer  Father   . COPD Mother   . Heart disease Mother   . Heart disease Maternal Grandmother   . Kidney disease Paternal Grandmother        Objective:    BP 130/74 mmHg  Pulse 88  Temp(Src) 99.1 F (37.3 C)  Resp 20  Ht 5' 8.5" (1.74 m)  Wt 222 lb (100.699 kg)  BMI 33.26 kg/m2  SpO2 98% Physical Exam  Constitutional: He is oriented to person, place, and time. He appears well-developed and well-nourished. No distress.  HENT:  Head: Normocephalic and atraumatic.  Right Ear: Tympanic membrane, external ear and ear canal normal.  Left Ear: Tympanic membrane, external ear and ear canal normal.  Nose: Mucosal edema and rhinorrhea present.  Right sinus exhibits maxillary sinus tenderness and frontal sinus tenderness. Left sinus exhibits maxillary sinus tenderness and frontal sinus tenderness.  Mouth/Throat: Uvula is midline, oropharynx is clear and moist and mucous membranes are normal. No oropharyngeal exudate or posterior oropharyngeal edema.  Eyes: Conjunctivae and EOM are normal. Pupils are equal, round, and reactive to light.  Neck: Normal range of motion. Neck supple. Carotid bruit is not present. No thyromegaly present.  Cardiovascular: Normal rate, regular rhythm, normal heart sounds and intact distal pulses.  Exam reveals no gallop and no friction rub.   No murmur heard. Pulmonary/Chest: Effort normal and breath sounds normal. He has no wheezes. He has no rales.  Lymphadenopathy:    He has no cervical adenopathy.  Neurological: He is alert and oriented to person, place, and time. No cranial nerve deficit.  Skin: Skin is warm and dry. No rash noted. He is not diaphoretic.  Psychiatric: He has a normal mood and affect. His behavior is normal.  Nursing note and vitals reviewed.       Assessment & Plan:   1. Acute recurrent maxillary sinusitis   2. Iliotibial band syndrome, right    -New for both dx. -Rx for Zithromax provided; continue Flonase, Mucinex. -Home exercise program for iliotibial band syndrome provided; recommend performing daily; also recommend avoiding leg exercises for the next week and then progressing slowly for the next two weeks.   No orders of the defined types were placed in this encounter.   Meds ordered this encounter  Medications  . azithromycin (ZITHROMAX) 200 MG/5ML suspension    Sig: Take 12.5 mLs (500 mg total) by mouth daily. For the first day then 7 ml daily for four days.  Grape flavor preferred.    Dispense:  42 mL    Refill:  0    Return if symptoms worsen or fail to improve.    Kristi Elayne Guerin, M.D. [facility] [address] [place], [place]  [address] [phone] phone [phone] fax

## 2015-05-15 ENCOUNTER — Encounter: Payer: Self-pay | Admitting: Family Medicine

## 2015-07-04 ENCOUNTER — Telehealth: Payer: Self-pay | Admitting: Family Medicine

## 2015-07-04 NOTE — Telephone Encounter (Signed)
I saw pt's wife in the office yesterday. Their son was diagnosed by the pediatrician on 2/21 with flu AND strep and put on tamiflu AND amoxicillin. Son had had flu shot. Wife had not had flu shot and so was started on tamiflu without testing as she had same-day onset of diaphoresis, clammy, myalgias, pharyngitis. Gave hard rx for amox in case only pharyngitis worsened. Wife reported that this pt has been intermittently ill for sev mos - last seen 6 wks ago for a sinus infxn. For the past 1-2 wks, he has been developing nasal cong and cough. Wife reassured that this was atypical for flu or strep but advised that if sxs suddenly worsen with true fever to call - family is leaving on vacation for a cruise in sev days.

## 2015-07-06 DIAGNOSIS — N529 Male erectile dysfunction, unspecified: Secondary | ICD-10-CM | POA: Insufficient documentation

## 2015-08-28 ENCOUNTER — Ambulatory Visit (INDEPENDENT_AMBULATORY_CARE_PROVIDER_SITE_OTHER): Payer: BLUE CROSS/BLUE SHIELD | Admitting: Physician Assistant

## 2015-08-28 VITALS — BP 136/84 | HR 73 | Temp 98.6°F | Resp 16 | Ht 69.0 in | Wt 221.4 lb

## 2015-08-28 DIAGNOSIS — J0101 Acute recurrent maxillary sinusitis: Secondary | ICD-10-CM

## 2015-08-28 MED ORDER — AZITHROMYCIN 200 MG/5ML PO SUSR: 500.0000 mg | Freq: Every day | ORAL | Status: DC

## 2015-08-28 MED ORDER — LORATADINE 5 MG PO CHEW: 10.0000 mg | CHEWABLE_TABLET | Freq: Every day | ORAL | Status: DC

## 2015-08-28 NOTE — Progress Notes (Signed)
[facility] [address], [place] [address] [phone]  Date:  08/28/2015   Name:  Jesse Steele   DOB:  [birth year]   MRN:  [id number]  PCP:  Suzanna Obey, MD    History of Present Illness:  Jesse Steele is a 45 y.o. male patient who presents to [facility] for cc of sinus pain.  Patient reports rhinorrhea and nasal congestion, and maxillary facial pain for more than 1 week.  He has sneezing and yellow thick mucus.   Sneezing yellow thick mucus.  He developed fever of 101.8 3 days ago.  He has one nose bleed secondary to blowing his nose hard.  There is some frontal sinus pain as well.     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  He did not have a mask, which he does normally.  He then developed the sneezing and coughing within a day but never improved.  He has a productive yellow/green mucus that has resolved.  He is taking mucinex which helped a lot.  He states he can not take pill form mediciations.  He has tried to take an allergy medicine in the past which helped his symptoms.   Patient Active Problem List   Diagnosis Date Noted  . GERD (gastroesophageal reflux disease) 05/27/2012  . Liver lesion 05/27/2012  . Stricture and stenosis of esophagus 11/03/2011  . Other dysphagia 11/03/2011  . ADHD (attention deficit hyperactivity disorder) 10/30/2011    Past Medical History  Diagnosis Date  . GERD (gastroesophageal reflux disease)   . ADHD (attention deficit hyperactivity disorder)   . Esophageal stricture   . Anxiety   . Liver lesion   . Diverticulosis   . Lumbar degenerative disc disease   . Low testosterone     Past Surgical History  Procedure Laterality Date  . Umbilical hernia repair    . Tonsillectomy    . Adenoidectomy    . Esophagogastroduodenoscopy  11/03/2011    Procedure: ESOPHAGOGASTRODUODENOSCOPY (EGD);  Surgeon: Lafayette Dragon, MD;  [facility];  Service: Endoscopy;  Laterality: N/A;  . Savory dilation  11/03/2011     Procedure: SAVORY DILATION;  Surgeon: Lafayette Dragon, MD;  Location: [facility];  Service: Endoscopy;  Laterality: N/A;  . Upper gastrointestinal endoscopy      Social History  Substance Use Topics  . Smoking status: Never Smoker   . Smokeless tobacco: Never Used  . Alcohol Use: 1.0 oz/week    2 Standard drinks or equivalent per week     Comment: 3 Drinks per week    Family History  Problem Relation Age of Onset  . Colon cancer Neg Hx   . Prostate cancer Father   . COPD Mother   . Heart disease Mother   . Heart disease Maternal Grandmother   . Kidney disease Paternal Grandmother     Allergies  Allergen Reactions  . Codeine Other (See Comments)    Reaction unknown (family allergy)  . Penicillins Nausea Only  . Sulfa Antibiotics Nausea Only    Medication list has been reviewed and updated.  Current Outpatient Prescriptions on File Prior to Visit  Medication Sig Dispense Refill  . amphetamine-dextroamphetamine (ADDERALL XR) 20 MG 24 hr capsule Take 20 mg by mouth.    . esomeprazole (NEXIUM) 40 MG capsule TAKE 1 CAPSULE (40 MG TOTAL) BY MOUTH DAILY BEFORE BREAKFAST. 90 capsule 3  . azithromycin (ZITHROMAX) 200 MG/5ML suspension Take 12.5 mLs (500 mg total) by  mouth daily. For the first day then 7 ml daily for four days.  Grape flavor preferred. (Patient not taking: Reported on 08/28/2015) 42 mL 0   Current Facility-Administered Medications on File Prior to Visit  Medication Dose Route Frequency Provider Last Rate Last Dose  . albuterol (PROVENTIL) (2.5 MG/3ML) 0.083% nebulizer solution 2.5 mg  2.5 mg Nebulization Once Shawnee Knapp, MD      . ipratropium (ATROVENT) nebulizer solution 0.5 mg  0.5 mg Nebulization Once Shawnee Knapp, MD        ROS   Physical Examination: BP 136/84 mmHg  Pulse 73  Temp(Src) 98.6 F (37 C) (Oral)  Resp 16  Ht 5\' 9"  (1.753 m)  Wt 221 lb 6.4 oz (100.426 kg)  BMI 32.68 kg/m2  SpO2 98% Ideal Body Weight: Weight in (lb) to have BMI = 25:  168.9  Physical Exam  Constitutional: He is oriented to person, place, and time. He appears well-developed and well-nourished. No distress.  HENT:  Head: Normocephalic and atraumatic.  Right Ear: Tympanic membrane, external ear and ear canal normal.  Left Ear: Tympanic membrane, external ear and ear canal normal.  Nose: Mucosal edema and rhinorrhea present. Right sinus exhibits no maxillary sinus tenderness and no frontal sinus tenderness. Left sinus exhibits no maxillary sinus tenderness and no frontal sinus tenderness.  Mouth/Throat: No uvula swelling. No oropharyngeal exudate, posterior oropharyngeal edema or posterior oropharyngeal erythema.  Eyes: Conjunctivae, EOM and lids are normal. Pupils are equal, round, and reactive to light. Right eye exhibits normal extraocular motion. Left eye exhibits normal extraocular motion.  Neck: Trachea normal and full passive range of motion without pain. No edema and no erythema present.  Cardiovascular: Normal rate and regular rhythm.  Exam reveals no gallop and no friction rub.   No murmur heard. Pulmonary/Chest: Effort normal. No respiratory distress. He has no decreased breath sounds. He has no wheezes. He has no rhonchi.  Neurological: He is alert and oriented to person, place, and time.  Skin: Skin is warm and dry. He is not diaphoretic.  Psychiatric: He has a normal mood and affect. His behavior is normal.     Assessment and Plan: Jesse Steele is a 45 y.o. male who is here today for sinus pain. I have advised that he continue the abx.  Advised to start the claritin.  If he does not have improvement within the next 72 hours, I have advised him to fill the zpak.  Acute recurrent maxillary sinusitis - Plan: azithromycin (ZITHROMAX) 200 MG/5ML suspension, loratadine (CLARITIN) 5 MG chewable tablet   Ivar Drape, PA-C [facility] 08/28/2015 9:43 AM

## 2015-08-28 NOTE — Patient Instructions (Addendum)
IF you received an x-ray today, you will receive an invoice from [facility]. Please contact [facility] at [phone] with questions or concerns regarding your invoice.   IF you received labwork today, you will receive an invoice from [employer]. Please contact [facility] at [phone] with questions or concerns regarding your invoice.   Our billing staff will not be able to assist you with questions regarding bills from these companies.  You will be contacted with the lab results as soon as they are available. The fastest way to get your results is to activate your My Chart account. Instructions are located on the last page of this paperwork. If you have not heard from [place] regarding the results in 2 weeks, please contact this office.    Please hydrate well with water 64 oz or more. I would like you to take the zyrtec, mucinex, and flonase for the next 3 days.  If you do not have improvement in 3 days, fill the zpak.   Please discard the prescription if not used.   Allergic Rhinitis Allergic rhinitis is when the mucous membranes in the nose respond to allergens. Allergens are particles in the air that cause your body to have an allergic reaction. This causes you to release allergic antibodies. Through a chain of events, these eventually cause you to release histamine into the blood stream. Although meant to protect the body, it is this release of histamine that causes your discomfort, such as frequent sneezing, congestion, and an itchy, runny nose.  CAUSES Seasonal allergic rhinitis (hay fever) is caused by pollen allergens that may come from grasses, trees, and weeds. Year-round allergic rhinitis (perennial allergic rhinitis) is caused by allergens such as house dust mites, pet dander, and mold spores. SYMPTOMS  Nasal stuffiness (congestion).  Itchy, runny nose with sneezing and tearing of the eyes. DIAGNOSIS Your health care provider can  help you determine the allergen or allergens that trigger your symptoms. If you and your health care provider are unable to determine the allergen, skin or blood testing may be used. Your health care provider will diagnose your condition after taking your health history and performing a physical exam. Your health care provider may assess you for other related conditions, such as asthma, pink eye, or an ear infection. TREATMENT Allergic rhinitis does not have a cure, but it can be controlled by:  Medicines that block allergy symptoms. These may include allergy shots, nasal sprays, and oral antihistamines.  Avoiding the allergen. Hay fever may often be treated with antihistamines in pill or nasal spray forms. Antihistamines block the effects of histamine. There are over-the-counter medicines that may help with nasal congestion and swelling around the eyes. Check with your health care provider before taking or giving this medicine. If avoiding the allergen or the medicine prescribed do not work, there are many new medicines your health care provider can prescribe. Stronger medicine may be used if initial measures are ineffective. Desensitizing injections can be used if medicine and avoidance does not work. Desensitization is when a patient is given ongoing shots until the body becomes less sensitive to the allergen. Make sure you follow up with your health care provider if problems continue. HOME CARE INSTRUCTIONS It is not possible to completely avoid allergens, but you can reduce your symptoms by taking steps to limit your exposure to them. It helps to know exactly what you are allergic to so that you can avoid your specific triggers. [place]  IF:  You have a fever.  You develop a cough that does not stop easily (persistent).  You have shortness of breath.  You start wheezing.  Symptoms interfere with normal daily activities.   This information is not intended to replace advice given  to you by your health care provider. Make sure you discuss any questions you have with your health care provider.   Document Released: 01/21/2001 Document Revised: 05/19/2014 Document Reviewed: 01/03/2013 Elsevier Interactive Patient Education [employer].

## 2015-12-26 DIAGNOSIS — F900 Attention-deficit hyperactivity disorder, predominantly inattentive type: Secondary | ICD-10-CM | POA: Diagnosis not present

## 2016-04-10 DIAGNOSIS — F900 Attention-deficit hyperactivity disorder, predominantly inattentive type: Secondary | ICD-10-CM | POA: Diagnosis not present

## 2016-04-11 ENCOUNTER — Ambulatory Visit (INDEPENDENT_AMBULATORY_CARE_PROVIDER_SITE_OTHER): Payer: BLUE CROSS/BLUE SHIELD | Admitting: Physician Assistant

## 2016-04-11 VITALS — BP 130/80 | HR 84 | Temp 98.5°F | Resp 17 | Ht 70.5 in | Wt 226.0 lb

## 2016-04-11 DIAGNOSIS — R05 Cough: Secondary | ICD-10-CM | POA: Diagnosis not present

## 2016-04-11 DIAGNOSIS — B349 Viral infection, unspecified: Secondary | ICD-10-CM | POA: Diagnosis not present

## 2016-04-11 DIAGNOSIS — J040 Acute laryngitis: Secondary | ICD-10-CM | POA: Diagnosis not present

## 2016-04-11 DIAGNOSIS — R059 Cough, unspecified: Secondary | ICD-10-CM

## 2016-04-11 MED ORDER — MUCINEX DM MAXIMUM STRENGTH 60-1200 MG PO TB12: 1.0000 | ORAL_TABLET | Freq: Two times a day (BID) | ORAL | 1 refills | Status: DC

## 2016-04-11 MED ORDER — HYDROCODONE-HOMATROPINE 5-1.5 MG/5ML PO SYRP: 5.0000 mL | ORAL_SOLUTION | Freq: Three times a day (TID) | ORAL | 0 refills | Status: DC | PRN

## 2016-04-11 NOTE — Progress Notes (Signed)
Jesse Steele  MRN: [id number] DOB: 1971/01/29  PCP: Suzanna Obey, MD  Subjective:  Pt is a 45 year old male PMH GERD, ADHD, liver lesion, who presents to clinic for sore throat x five days. He reports hoarse voice, which was worse yesterday. Seems to be improving. Notes associated cough and post nasal drip. He had a fever two days ago of 101. Not sleeping well due to cough and congestion. He is eating and drinking well.  Has tried mucinex DM and Flonase, helped some. Reports having sick contacts at work.  Denies chest pain, palpitations, nausea, vomiting, chills, difficulty swallowing, headache.  Review of Systems  Constitutional: Positive for fever. Negative for chills and diaphoresis.  HENT: Positive for congestion, rhinorrhea, sore throat and voice change.   Respiratory: Positive for cough. Negative for chest tightness, shortness of breath and wheezing.   Cardiovascular: Negative for chest pain, palpitations and leg swelling.  Gastrointestinal: Negative for diarrhea, nausea and vomiting.  Neurological: Negative for dizziness, syncope, light-headedness and headaches.  Psychiatric/Behavioral: Positive for sleep disturbance. The patient is not nervous/anxious.     Patient Active Problem List   Diagnosis Date Noted  . GERD (gastroesophageal reflux disease) 05/27/2012  . Liver lesion 05/27/2012  . Stricture and stenosis of esophagus 11/03/2011  . Other dysphagia 11/03/2011  . ADHD (attention deficit hyperactivity disorder) 10/30/2011    Current Outpatient Prescriptions on File Prior to Visit  Medication Sig Dispense Refill  . amphetamine-dextroamphetamine (ADDERALL XR) 20 MG 24 hr capsule Take 20 mg by mouth.    Marland Kitchen guaiFENesin (MUCINEX) 600 MG 12 hr tablet Take by mouth 2 (two) times daily.     Current Facility-Administered Medications on File Prior to Visit  Medication Dose Route Frequency Provider Last Rate Last Dose  . albuterol (PROVENTIL) (2.5 MG/3ML) 0.083% nebulizer  solution 2.5 mg  2.5 mg Nebulization Once Shawnee Knapp, MD      . ipratropium (ATROVENT) nebulizer solution 0.5 mg  0.5 mg Nebulization Once Shawnee Knapp, MD        Allergies  Allergen Reactions  . Codeine Other (See Comments)    Reaction unknown (family allergy)  . Penicillins Nausea Only  . Sulfa Antibiotics Nausea Only     Objective:  BP 130/80 (BP Location: Right Arm, Patient Position: Sitting, Cuff Size: Normal)   Pulse 84   Temp 98.5 F (36.9 C) (Oral)   Resp 17   Ht 5' 10.5" (1.791 m)   Wt 226 lb (102.5 kg)   SpO2 96%   BMI 31.97 kg/m   Physical Exam  Constitutional: He is oriented to person, place, and time and well-developed, well-nourished, and in no distress. No distress.  HENT:  Right Ear: Tympanic membrane normal.  Left Ear: Tympanic membrane normal.  Nose: Mucosal edema present.  Mouth/Throat: Posterior oropharyngeal edema present. No oropharyngeal exudate, posterior oropharyngeal erythema or tonsillar abscesses.  Cardiovascular: Normal rate, regular rhythm and normal heart sounds.   Pulmonary/Chest: Effort normal and breath sounds normal. He has no decreased breath sounds. He has no wheezes. He has no rhonchi. He has no rales.  Neurological: He is alert and oriented to person, place, and time. GCS score is 15.  Skin: Skin is warm and dry.  Psychiatric: Mood, memory, affect and judgment normal.  Vitals reviewed.   Assessment and Plan :  1. Viral illness 2. Laryngitis 3. Cough - HYDROcodone-homatropine (HYCODAN) 5-1.5 MG/5ML syrup; Take 5 mLs by mouth every 8 (eight) hours as needed for cough.  Dispense:  120 mL; Refill: 0 - Dextromethorphan-Guaifenesin (MUCINEX DM MAXIMUM STRENGTH) 60-1200 MG TB12; Take 1 tablet by mouth every 12 (twelve) hours.  Dispense: 14 each; Refill: 1 - Supportive care encouraged: Voice rest, ibuprofen, drink plenty of fluids, hot steamy showers, humidifier. RTC in 5-7 days if no improvement/symptoms worsen.   Mercer Pod, PA-C    [facility] 04/11/2016 9:51 AM

## 2016-04-11 NOTE — Patient Instructions (Addendum)
Laryngitis Introduction Laryngitis is inflammation of your vocal cords. This causes hoarseness, coughing, loss of voice, sore throat, or a dry throat. Your vocal cords are two bands of muscles that are found in your throat. When you speak, these cords come together and vibrate. These vibrations come out through your mouth as sound. When your vocal cords are inflamed, your voice sounds different. Laryngitis can be temporary (acute) or long-term (chronic). Most cases of acute laryngitis improve with time. Chronic laryngitis is laryngitis that lasts for more than three weeks. What are the causes? Acute laryngitis may be caused by:  A viral infection.  Lots of talking, yelling, or singing. This is also called vocal strain.  Bacterial infections. Chronic laryngitis may be caused by:  Vocal strain.  Injury to your vocal cords.  Acid reflux (gastroesophageal reflux disease or GERD).  Allergies.  Sinus infection.  Smoking.  Alcohol abuse.  Breathing in chemicals or dust.  Growths on the vocal cords. What increases the risk? Risk factors for laryngitis include:  Smoking.  Alcohol abuse.  Having allergies. What are the signs or symptoms? Symptoms of laryngitis may include:  Low, hoarse voice.  Loss of voice.  Dry cough.  Sore throat.  Stuffy nose. How is this diagnosed? Laryngitis may be diagnosed by:  Physical exam.  Throat culture.  Blood test.  Laryngoscopy. This procedure allows your health care provider to look at your vocal cords with a mirror or viewing tube. How is this treated? Treatment for laryngitis depends on what is causing it. Usually, treatment involves resting your voice and using medicines to soothe your throat. However, if your laryngitis is caused by a bacterial infection, you may need to take antibiotic medicine. If your laryngitis is caused by a growth, you may need to have a procedure to remove it. Follow these instructions at  home:  Drink enough fluid to keep your urine clear or pale yellow.  Breathe in moist air. Use a humidifier if you live in a dry climate.  Take medicines only as directed by your health care provider.  If you were prescribed an antibiotic medicine, finish it all even if you start to feel better.  Do not smoke cigarettes or electronic cigarettes. If you need help quitting, ask your health care provider.  Talk as little as possible. Also avoid whispering, which can cause vocal strain.  Write instead of talking. Do this until your voice is back to normal. Contact a health care provider if:  You have a fever.  You have increasing pain.  You have difficulty swallowing. Get help right away if:  You cough up blood.  You have trouble breathing. This information is not intended to replace advice given to you by your health care provider. Make sure you discuss any questions you have with your health care provider. Document Released: 04/28/2005 Document Revised: 10/04/2015 Document Reviewed: 10/11/2013  2017 Elsevier     IF you received an x-ray today, you will receive an invoice from [facility]. Please contact [facility] at [phone] with questions or concerns regarding your invoice.   IF you received labwork today, you will receive an invoice from [employer]. Please contact [facility] at [phone] with questions or concerns regarding your invoice.   Our billing staff will not be able to assist you with questions regarding bills from these companies.  You will be contacted with the lab results as soon as they are available. The fastest way to get your results is to  activate your My Chart account. Instructions are located on the last page of this paperwork. If you have not heard from [place] regarding the results in 2 weeks, please contact this office.

## 2016-04-23 ENCOUNTER — Ambulatory Visit (INDEPENDENT_AMBULATORY_CARE_PROVIDER_SITE_OTHER): Payer: BLUE CROSS/BLUE SHIELD | Admitting: Family Medicine

## 2016-04-23 VITALS — BP 126/84 | HR 94 | Temp 99.7°F | Resp 18 | Ht 70.5 in | Wt 224.0 lb

## 2016-04-23 DIAGNOSIS — L089 Local infection of the skin and subcutaneous tissue, unspecified: Secondary | ICD-10-CM | POA: Diagnosis not present

## 2016-04-23 DIAGNOSIS — L723 Sebaceous cyst: Secondary | ICD-10-CM

## 2016-04-23 MED ORDER — DOXYCYCLINE MONOHYDRATE 25 MG/5ML PO SUSR: 100.0000 mg | Freq: Two times a day (BID) | ORAL | 0 refills | Status: DC

## 2016-04-23 NOTE — Progress Notes (Signed)
By signing my name below, I, Mesha Guinyard, attest that this documentation has been prepared under the direction and in the presence of Delman Cheadle, MD.  Electronically Signed: Verlee Monte, Medical Scribe. 04/23/16. 8:19 AM.  Subjective:    Patient ID: Jesse Steele, male    DOB: 1971/02/13, 45 y.o.   MRN: [id number]  HPI Chief Complaint  Patient presents with  . Cyst    ON RIGHT KNEE    HPI Comments: Jesse Steele is a 45 y.o. male who [facility] complaining of nodule on right popliteal fossa that was noticed 3-4 days ago. It hurts when he does leg curls in the gym and he isn't sure if it's been draining. In 2015 he had a similar smaller nodule in the same spot. When he went to the dermatologist to get it checked out he was told it was a cyst.  Pt states he got over his bronchitis.   He works in [employer] and on his feet a lot. 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  Patient Active Problem List   Diagnosis Date Noted  . GERD (gastroesophageal reflux disease) 05/27/2012  . Liver lesion 05/27/2012  . Stricture and stenosis of esophagus 11/03/2011  . Other dysphagia 11/03/2011  . ADHD (attention deficit hyperactivity disorder) 10/30/2011   Past Medical History:  Diagnosis Date  . ADHD (attention deficit hyperactivity disorder)   . Anxiety   . Diverticulosis   . Esophageal stricture   . GERD (gastroesophageal reflux disease)   . Liver lesion   . Low testosterone   . Lumbar degenerative disc disease    Past Surgical History:  Procedure Laterality Date  . ADENOIDECTOMY    . ESOPHAGOGASTRODUODENOSCOPY  11/03/2011   Procedure: ESOPHAGOGASTRODUODENOSCOPY (EGD);  Surgeon: Lafayette Dragon, MD;  [facility];  Service: Endoscopy;  Laterality: N/A;  . SAVORY DILATION  11/03/2011   Procedure: SAVORY DILATION;  Surgeon: Lafayette Dragon, MD;  Location: [facility];  Service: Endoscopy;  Laterality: N/A;    . TONSILLECTOMY    . UMBILICAL HERNIA REPAIR    . UPPER GASTROINTESTINAL ENDOSCOPY     Allergies  Allergen Reactions  . Codeine Other (See Comments)    Reaction unknown (family allergy)  . Penicillins Nausea Only  . Sulfa Antibiotics Nausea Only   Prior to Admission medications   Medication Sig Start Date End Date Taking? Authorizing Provider  amphetamine-dextroamphetamine (ADDERALL XR) 20 MG 24 hr capsule Take 20 mg by mouth. 05/16/14  Yes Historical Provider, MD  Dextromethorphan-Guaifenesin (MUCINEX DM MAXIMUM STRENGTH) 60-1200 MG TB12 Take 1 tablet by mouth every 12 (twelve) hours. 04/11/16  Yes Elizabeth Whitney McVey, PA-C  guaiFENesin (MUCINEX) 600 MG 12 hr tablet Take by mouth 2 (two) times daily.   Yes Historical Provider, MD  HYDROcodone-homatropine (HYCODAN) 5-1.5 MG/5ML syrup Take 5 mLs by mouth every 8 (eight) hours as needed for cough. 04/11/16  Yes Gelene Mink McVey, PA-C   Social History   Social History  . Marital status: Married    Spouse name: N/A  . Number of children: 1  . Years of education: N/A   Occupational History  . Sales    Social History Main Topics  . Smoking status: Never Smoker  . Smokeless tobacco: Never Used  . Alcohol use 1.0 oz/week    2 Standard drinks or equivalent per week     Comment: 3 Drinks per week  . Drug  use: No  . Sexual activity: Yes    Partners: Female   Other Topics Concern  . Not on file   Social History Narrative   Daily caffeine    Review of Systems  Constitutional: Negative for fever.  HENT: Negative for congestion, rhinorrhea, sore throat and voice change.   Respiratory: Negative for cough and wheezing.   Skin: Positive for color change. Negative for wound.   Objective:  Physical Exam  Constitutional: He appears well-developed and well-nourished. No distress.  HENT:  Head: Normocephalic and atraumatic.  Eyes: Conjunctivae are normal.  Neck: Neck supple.  Cardiovascular: Normal rate.    Pulmonary/Chest: Effort normal.  Neurological: He is alert.  Skin: Skin is warm and dry.  4x6 cm cyst on the right lateral popliteal fossa Well defined: fluctuant, tender, erythema, not draining  Psychiatric: He has a normal mood and affect. His behavior is normal.  Nursing note and vitals reviewed.  BP 126/84 (BP Location: Right Arm, Patient Position: Sitting, Cuff Size: Large)   Pulse 94   Temp 99.7 F (37.6 C) (Oral)   Resp 18   Ht 5' 10.5" (1.791 m)   Wt 224 lb (101.6 kg)   SpO2 95%   BMI 31.69 kg/m  Assessment & Plan:   1. Infected sebaceous cyst     Meds ordered this encounter  Medications  . doxycycline (VIBRAMYCIN) 25 MG/5ML SUSR    Sig: Take 20 mLs (100 mg total) by mouth 2 (two) times daily.    Dispense:  400 mL    Refill:  0    I personally performed the services described in this documentation, which was scribed in my presence. The recorded information has been reviewed and considered, and addended by me as needed.   Delman Cheadle, M.D.  [facility] [address] [place], [place] [address] [phone] phone [phone] fax  05/16/16 3:22 AM

## 2016-04-23 NOTE — Progress Notes (Signed)
PROCEDURE NOTE: Sebaceous cyst excision Verbal consent obtained. Local anesthesia with 3cc of 2% lidocaine with epinephrine. Sterile prep and drape. An elliptical incision was made extending ~2cm using a 15 blade, the cyst was dissected from the surrounding tissue with curved hemostats. Wound loosely approximated to allow for drainage with #1 4-0 Ethilon horizontal mattress suture. Cleansed and dressed.   Jaynee Eagles, PA-C [facility] [phone] 04/23/2016  9:14 AM

## 2016-04-23 NOTE — Patient Instructions (Addendum)
Please change your dressing tomorrow and return to our clinic for a recheck on Friday 04/25/2016.    Epidermal Cyst Removal, Care After Introduction Refer to this sheet in the next few weeks. These instructions provide you with information about caring for yourself after your procedure. Your health care provider may also give you more specific instructions. Your treatment has been planned according to current medical practices, but problems sometimes occur. Call your health care provider if you have any problems or questions after your procedure. What can I expect after the procedure? After the procedure, it is common to have:  Soreness in the area where your cyst was removed.  Tightness or itching from your skin sutures. Follow these instructions at home:  Take medicines only as directed by your health care provider.  If you were prescribed an antibiotic medicine, finish all of it even if you start to feel better.  Use antibiotic ointment as directed by your health care provider. Follow the instructions carefully.  There are many different ways to close and cover an incision, including stitches (sutures), skin glue, and adhesive strips. Follow your health care provider's instructions about:  Incision care.  Bandage (dressing) changes and removal.  Incision closure removal.  Keep the bandage (dressing) dry until your health care provider says that it can be removed. Take sponge baths only. Ask your health care provider when you can start showering or taking a bath.  After your dressing is off, check your incision every day for signs of infection. Watch for:  Redness, swelling, or pain.  Fluid, blood, or pus.  You can return to your normal activities. Do not do anything that stretches or puts pressure on your incision.  You can return to your normal diet.  Keep all follow-up visits as directed by your health care provider. This is important. Contact a health care provider  if:  You have a fever.  Your incision bleeds.  You have redness, swelling, or pain in the incision area.  You have fluid, blood, or pus coming from your incision.  Your cyst comes back after surgery. This information is not intended to replace advice given to you by your health care provider. Make sure you discuss any questions you have with your health care provider. Document Released: 05/19/2014 Document Revised: 10/04/2015 Document Reviewed: 01/11/2014  2017 Elsevier     IF you received an x-ray today, you will receive an invoice from [facility]. Please contact [facility] at [phone] with questions or concerns regarding your invoice.   IF you received labwork today, you will receive an invoice from [employer]. Please contact [facility] at [phone] with questions or concerns regarding your invoice.   Our billing staff will not be able to assist you with questions regarding bills from these companies.  You will be contacted with the lab results as soon as they are available. The fastest way to get your results is to activate your My Chart account. Instructions are located on the last page of this paperwork. If you have not heard from [place] regarding the results in 2 weeks, please contact this office.

## 2016-04-25 ENCOUNTER — Ambulatory Visit (INDEPENDENT_AMBULATORY_CARE_PROVIDER_SITE_OTHER): Payer: BLUE CROSS/BLUE SHIELD | Admitting: Physician Assistant

## 2016-04-25 VITALS — HR 96 | Temp 98.7°F | Resp 16

## 2016-04-25 DIAGNOSIS — L723 Sebaceous cyst: Secondary | ICD-10-CM

## 2016-04-25 DIAGNOSIS — L089 Local infection of the skin and subcutaneous tissue, unspecified: Secondary | ICD-10-CM

## 2016-04-25 NOTE — Progress Notes (Signed)
   04/25/2016 8:18 AM   DOB: 1970-06-22 / MRN: [id number]  SUBJECTIVE:  Jesse Steele is a well appearing 45 y.o. here today for wound care. He denies exquisite tenderness at the site of the wound, nausea, emesis, fever and chills.  He has been compliant with medical therapy and recommendations thus far.   He is allergic to codeine; penicillins; and sulfa antibiotics.   He  has a past medical history of ADHD (attention deficit hyperactivity disorder); Anxiety; Diverticulosis; Esophageal stricture; GERD (gastroesophageal reflux disease); Liver lesion; Low testosterone; and Lumbar degenerative disc disease.    He  reports that he has never smoked. He has never used smokeless tobacco. He reports that he drinks about 1.0 oz of alcohol per week . He reports that he does not use drugs. He  reports that he currently engages in sexual activity and has had male partners. The patient  has a past surgical history that includes Umbilical hernia repair; Tonsillectomy; Adenoidectomy; Esophagogastroduodenoscopy (11/03/2011); Savory dilation (11/03/2011); and Upper gastrointestinal endoscopy.  His family history includes COPD in his mother; Heart disease in his maternal grandmother and mother; Kidney disease in his paternal grandmother; Prostate cancer in his father.  Review of Systems  Constitutional: Negative for chills and fever.  Gastrointestinal: Negative for nausea.  Skin: Negative for itching.  Neurological: Negative for dizziness.    Problem list and medications reviewed and updated by myself where necessary, and exist elsewhere in the encounter.   OBJECTIVE:  Pulse 96   Temp 98.7 F (37.1 C)   Resp 16   SpO2 99%  CrCl cannot be calculated (Patient's most recent lab result is older than the maximum 21 days allowed.).  Physical Exam  Constitutional: He is oriented to person, place, and time. He appears well-developed. He does not appear ill.  Eyes: Conjunctivae and EOM are normal. Pupils are  equal, round, and reactive to light.  Cardiovascular: Normal rate.   Pulmonary/Chest: Effort normal.  Abdominal: He exhibits no distension.  Musculoskeletal: Normal range of motion.  Neurological: He is alert and oriented to person, place, and time. No cranial nerve deficit. Coordination normal.  Skin: Skin is warm and dry. He is not diaphoretic.     Psychiatric: He has a normal mood and affect.  Nursing note and vitals reviewed.   No results found for this or any previous visit (from the past 48 hour(s)).  ASSESSMENT AND PLAN  Jesse Steele was seen today for wound check.  Diagnoses and all orders for this visit:  Infected sebaceous cyst Comments: S/P cystectomy.  Wound sutured and healing.  He is taking abx.  He will rtc in ten days for suture removal.     The patient was advised to call or return to clinic if he does not see an improvement in symptoms or to seek the care of the closest emergency department if he worsens with the above plan.   Philis Fendt, MHS, PA-C [facility] 04/25/2016 8:18 AM

## 2016-05-02 ENCOUNTER — Ambulatory Visit (INDEPENDENT_AMBULATORY_CARE_PROVIDER_SITE_OTHER): Payer: BLUE CROSS/BLUE SHIELD | Admitting: Physician Assistant

## 2016-05-02 VITALS — BP 130/76 | HR 67 | Temp 97.9°F | Resp 16 | Ht 70.5 in | Wt 228.8 lb

## 2016-05-02 DIAGNOSIS — L723 Sebaceous cyst: Secondary | ICD-10-CM

## 2016-05-02 DIAGNOSIS — L089 Local infection of the skin and subcutaneous tissue, unspecified: Secondary | ICD-10-CM

## 2016-05-02 NOTE — Patient Instructions (Signed)
     IF you received an x-ray today, you will receive an invoice from [facility]. Please contact [facility] at [phone] with questions or concerns regarding your invoice.   IF you received labwork today, you will receive an invoice from [facility]. Please contact [facility] at [phone] with questions or concerns regarding your invoice.   Our billing staff will not be able to assist you with questions regarding bills from these companies.  You will be contacted with the lab results as soon as they are available. The fastest way to get your results is to activate your My Chart account. Instructions are located on the last page of this paperwork. If you have not heard from us regarding the results in 2 weeks, please contact this office.     

## 2016-05-02 NOTE — Progress Notes (Signed)
   05/02/2016 8:31 AM   DOB: [birth year] / MRN: [id number]  SUBJECTIVE:  Mr. Bixler is a well appearing 45 y.o. here today for wound care. He denies exquisite tenderness at the site of the wound, nausea, emesis, fever and chills.  He has been compliant with medical therapy and recommendations thus far.   He is allergic to codeine; penicillins; and sulfa antibiotics.   He  has a past medical history of ADHD (attention deficit hyperactivity disorder); Anxiety; Diverticulosis; Esophageal stricture; GERD (gastroesophageal reflux disease); Liver lesion; Low testosterone; and Lumbar degenerative disc disease.    He  reports that he has never smoked. He has never used smokeless tobacco. He reports that he drinks about 1.0 oz of alcohol per week . He reports that he does not use drugs. He  reports that he currently engages in sexual activity and has had male partners. The patient  has a past surgical history that includes Umbilical hernia repair; Tonsillectomy; Adenoidectomy; Esophagogastroduodenoscopy (11/03/2011); Savory dilation (11/03/2011); and Upper gastrointestinal endoscopy.  His family history includes COPD in his mother; Heart disease in his maternal grandmother and mother; Kidney disease in his paternal grandmother; Prostate cancer in his father.  Review of Systems  Constitutional: Negative for fever.  Gastrointestinal: Negative for heartburn.  Skin: Negative for rash.  Neurological: Negative for dizziness.    Problem list and medications reviewed and updated by myself where necessary, and exist elsewhere in the encounter.   OBJECTIVE:  BP 130/76   Pulse 67   Temp 97.9 F (36.6 C) (Oral)   Resp 16   Ht 5' 10.5" (1.791 m)   Wt 228 lb 12.8 oz (103.8 kg)   SpO2 98%   BMI 32.37 kg/m  CrCl cannot be calculated (Patient's most recent lab result is older than the maximum 21 days allowed.).  Physical Exam  Cardiovascular: Normal rate and regular rhythm.   Pulmonary/Chest: Effort normal  and breath sounds normal.  Musculoskeletal: Normal range of motion.  Skin:       No results found for this or any previous visit (from the past 48 hour(s)).  ASSESSMENT AND PLAN  Jacorion was seen today for follow-up.  Diagnoses and all orders for this visit:  Infected sebaceous cyst Comments: Resolving.  Suture removed. Wound healing as expected.  Follow up as needed.  Orders: -     Care order/instruction:    The patient was advised to call or return to clinic if he does not see an improvement in symptoms or to seek the care of the closest emergency department if he worsens with the above plan.   Philis Fendt, MHS, PA-C [facility]  05/02/2016 8:31 AM

## 2016-05-26 ENCOUNTER — Ambulatory Visit (INDEPENDENT_AMBULATORY_CARE_PROVIDER_SITE_OTHER): Payer: BLUE CROSS/BLUE SHIELD | Admitting: Family Medicine

## 2016-05-26 VITALS — BP 126/82 | HR 95 | Temp 99.3°F | Resp 18 | Ht 70.5 in | Wt 227.0 lb

## 2016-05-26 DIAGNOSIS — Z20828 Contact with and (suspected) exposure to other viral communicable diseases: Secondary | ICD-10-CM

## 2016-05-26 DIAGNOSIS — M722 Plantar fascial fibromatosis: Secondary | ICD-10-CM | POA: Diagnosis not present

## 2016-05-26 DIAGNOSIS — R1032 Left lower quadrant pain: Secondary | ICD-10-CM

## 2016-05-26 DIAGNOSIS — R5081 Fever presenting with conditions classified elsewhere: Secondary | ICD-10-CM

## 2016-05-26 DIAGNOSIS — R197 Diarrhea, unspecified: Secondary | ICD-10-CM | POA: Diagnosis not present

## 2016-05-26 LAB — POCT CBC
Granulocyte percent: 82.4 %G — AB (ref 37–80)
HCT, POC: 42.2 % — AB (ref 43.5–53.7)
HEMOGLOBIN: 14.8 g/dL (ref 14.1–18.1)
LYMPH, POC: 0.7 (ref 0.6–3.4)
MCH, POC: 29.6 pg (ref 27–31.2)
MCHC: 35 g/dL (ref 31.8–35.4)
MCV: 84.4 fL (ref 80–97)
MID (cbc): 0.5 (ref 0–0.9)
MPV: 6.9 fL (ref 0–99.8)
PLATELET COUNT, POC: 188 10*3/uL (ref 142–424)
POC Granulocyte: 5.8 (ref 2–6.9)
POC LYMPH PERCENT: 10.6 %L (ref 10–50)
POC MID %: 7 % (ref 0–12)
RBC: 5 M/uL (ref 4.69–6.13)
RDW, POC: 12.9 %
WBC: 7 10*3/uL (ref 4.6–10.2)

## 2016-05-26 LAB — POCT INFLUENZA A/B
INFLUENZA A, POC: NEGATIVE
INFLUENZA B, POC: NEGATIVE

## 2016-05-26 NOTE — Progress Notes (Signed)
Subjective:  By signing my name below, I, Raven Small, attest that this documentation has been prepared under the direction and in the presence of Merri Ray, MD.  Electronically Signed: Thea Alken, ED Scribe. 05/26/2016. 3:22 PM.  Patient ID: Jesse Steele, male    DOB: [birth year], 46 y.o.   MRN: [id number]  HPI Chief Complaint  Patient presents with  . Fatigue  . Diarrhea  . OTHER    Pt. has been exposed to flu and strep    HPI Comments: Jesse Steele is a 46 y.o. male with reports hx of IBS who presents to [facility] Care at [facility] complaining of watery, yellow diarrhea that began 2 days ago. Pt reports associated rhinorrhea, fever of 101.2 this morning, body aches, HA, left abdominal pain and abdominal distention. Pt's son was diagnosed with both flu and strep throat 5 days ago. Pt reports hx of IBS. States current symptoms are similar to past IBS flares.Pt denies hx intestinal surgeries.  He denies cough, sore throat, congestion.    Pt also complains of left foot pain along plantar aspect. Reports pain worse first thing in the morning.   Patient Active Problem List   Diagnosis Date Noted  . GERD (gastroesophageal reflux disease) 05/27/2012  . Liver lesion 05/27/2012  . Stricture and stenosis of esophagus 11/03/2011  . Other dysphagia 11/03/2011  . ADHD (attention deficit hyperactivity disorder) 10/30/2011   Past Medical History:  Diagnosis Date  . ADHD (attention deficit hyperactivity disorder)   . Anxiety   . Diverticulosis   . Esophageal stricture   . GERD (gastroesophageal reflux disease)   . Liver lesion   . Low testosterone   . Lumbar degenerative disc disease    Past Surgical History:  Procedure Laterality Date  . ADENOIDECTOMY    . ESOPHAGOGASTRODUODENOSCOPY  11/03/2011   Procedure: ESOPHAGOGASTRODUODENOSCOPY (EGD);  Surgeon: Lafayette Dragon, MD;  Location: [facility];  Service: Endoscopy;  Laterality: N/A;  . SAVORY DILATION  11/03/2011   Procedure: SAVORY  DILATION;  Surgeon: Lafayette Dragon, MD;  Location: [facility];  Service: Endoscopy;  Laterality: N/A;  . TONSILLECTOMY    . UMBILICAL HERNIA REPAIR    . UPPER GASTROINTESTINAL ENDOSCOPY     Allergies  Allergen Reactions  . Codeine Other (See Comments)    Reaction unknown (family allergy)  . Penicillins Nausea Only  . Sulfa Antibiotics Nausea Only   Prior to Admission medications   Medication Sig Start Date End Date Taking? Authorizing Provider  amphetamine-dextroamphetamine (ADDERALL XR) 20 MG 24 hr capsule Take 20 mg by mouth. 05/16/14  Yes Historical Provider, MD   Social History   Social History  . Marital status: Married    Spouse name: N/A  . Number of children: 1  . Years of education: N/A   Occupational History  . Sales    Social History Main Topics  . Smoking status: Never Smoker  . Smokeless tobacco: Never Used  . Alcohol use 1.0 oz/week    2 Standard drinks or equivalent per week     Comment: 3 Drinks per week  . Drug use: No  . Sexual activity: Yes    Partners: Female   Other Topics Concern  . Not on file   Social History Narrative   Daily caffeine    Review of Systems  Constitutional: Positive for chills and fever.  HENT: Positive for rhinorrhea. Negative for congestion and sore throat.   Respiratory: Negative for cough.   Gastrointestinal: Positive for abdominal  distention, abdominal pain and diarrhea.  Musculoskeletal: Positive for myalgias.  Neurological: Positive for headaches.    Objective:   Physical Exam  Constitutional: He is oriented to person, place, and time. He appears well-developed and well-nourished. No distress.  HENT:  Right Ear: Tympanic membrane, external ear and ear canal normal.  Left Ear: Tympanic membrane, external ear and ear canal normal.  Mouth/Throat: Oropharynx is clear and moist. No oropharyngeal exudate or posterior oropharyngeal erythema.  Cardiovascular: Normal rate, regular rhythm and normal heart sounds.   No  murmur heard. Pulmonary/Chest: Effort normal and breath sounds normal. He has no wheezes. He has no rales.  Abdominal: Bowel sounds are normal. There is tenderness in the left lower quadrant. There is no rebound and no guarding.  Musculoskeletal:  Complains of pain at plantar fascia  Neurological: He is alert and oriented to person, place, and time.  Skin: Skin is warm and dry.  Psychiatric: He has a normal mood and affect. His behavior is normal.    Vitals:   05/26/16 1503  BP: 126/82  Pulse: 95  Resp: 18  SpO2: 99%  Weight: 227 lb (103 kg)  Height: 5' 10.5" (1.791 m)    Results for orders placed or performed in visit on 05/26/16  POCT CBC  Result Value Ref Range   WBC 7.0 4.6 - 10.2 K/uL   Lymph, poc 0.7 0.6 - 3.4   POC LYMPH PERCENT 10.6 10 - 50 %L   MID (cbc) 0.5 0 - 0.9   POC MID % 7.0 0 - 12 %M   POC Granulocyte 5.8 2 - 6.9   Granulocyte percent 82.4 (A) 37 - 80 %G   RBC 5.00 4.69 - 6.13 M/uL   Hemoglobin 14.8 14.1 - 18.1 g/dL   HCT, POC 42.2 (A) 43.5 - 53.7 %   MCV 84.4 80 - 97 fL   MCH, POC 29.6 27 - 31.2 pg   MCHC 35.0 31.8 - 35.4 g/dL   RDW, POC 12.9 %   Platelet Count, POC 188 142 - 424 K/uL   MPV 6.9 0 - 99.8 fL   Results for orders placed or performed in visit on 05/26/16  POCT CBC  Result Value Ref Range   WBC 7.0 4.6 - 10.2 K/uL   Lymph, poc 0.7 0.6 - 3.4   POC LYMPH PERCENT 10.6 10 - 50 %L   MID (cbc) 0.5 0 - 0.9   POC MID % 7.0 0 - 12 %M   POC Granulocyte 5.8 2 - 6.9   Granulocyte percent 82.4 (A) 37 - 80 %G   RBC 5.00 4.69 - 6.13 M/uL   Hemoglobin 14.8 14.1 - 18.1 g/dL   HCT, POC 42.2 (A) 43.5 - 53.7 %   MCV 84.4 80 - 97 fL   MCH, POC 29.6 27 - 31.2 pg   MCHC 35.0 31.8 - 35.4 g/dL   RDW, POC 12.9 %   Platelet Count, POC 188 142 - 424 K/uL   MPV 6.9 0 - 99.8 fL  POCT Influenza A/B  Result Value Ref Range   Influenza A, POC Negative Negative   Influenza B, POC Negative Negative    Assessment & Plan:    Jesse Steele is a 46 y.o.  male Fever in other diseases - Plan: POCT CBC, POCT Influenza A/B LLQ abdominal pain - Plan: POCT CBC Exposure to influenza - Plan: POCT CBC, POCT Influenza A/B  - Reassuring CBC with slight left shift but overall normal WBC. Reviewed  CT scan from December 2013 indicating diverticulosis without diverticulitis. Based on location of pain and low-grade fever, discussed potential for diverticulitis. If pain not improving in the next 48 hours, or persistent fever, would recommend CT scanning to rule out diverticulitis. RTC precautions if worse sooner. Symptomatic care for now for possible viral illness.  Plantar fasciitis of left foot  - Handout given and exercise as discussed.  Diarrhea, unspecified type - Plan: POCT Influenza A/B  - As above. Reports history of IBS, but reports bloating and low-grade fevers in the past with his attacks, that are less likely IBS and more concerning for possible diverticulitis in the past. If symptoms are not improving in the next 2 days, check CT abdomen pelvis.  No orders of the defined types were placed in this encounter.  Patient Instructions    Your foot pain does appear to be plantar fasciitis. See information below and try the stretches that I discussed in the office.  Your symptoms do appear to be due to a virus, but will let you know this afternoon if the flu test is positive. If so, I do not mind sending in Tamiflu liquid to your pharmacy. Tylenol or Motrin if needed for fever, out of work until fever free for 24 hours.   If abdominal pain is not improving later this week or any worsening symptoms sooner, return for recheck.   Plantar Fasciitis Plantar fasciitis is a painful foot condition that affects the heel. It occurs when the band of tissue that connects the toes to the heel bone (plantar fascia) becomes irritated. This can happen after exercising too much or doing other repetitive activities (overuse injury). The pain from plantar fasciitis can  range from mild irritation to severe pain that makes it difficult for you to walk or move. The pain is usually worse in the morning or after you have been sitting or lying down for a while. CAUSES This condition may be caused by:  Standing for long periods of time.  Wearing shoes that do not fit.  Doing high-impact activities, including running, aerobics, and ballet.  Being overweight.  Having an abnormal way of walking (gait).  Having tight calf muscles.  Having high arches in your feet.  Starting a new athletic activity. SYMPTOMS The main symptom of this condition is heel pain. Other symptoms include:  Pain that gets worse after activity or exercise.  Pain that is worse in the morning or after resting.  Pain that goes away after you walk for a few minutes. DIAGNOSIS This condition may be diagnosed based on your signs and symptoms. Your health care provider will also do a physical exam to check for:  A tender area on the bottom of your foot.  A high arch in your foot.  Pain when you move your foot.  Difficulty moving your foot. You may also need to have imaging studies to confirm the diagnosis. These can include:  X-rays.  Ultrasound.  MRI. TREATMENT  Treatment for plantar fasciitis depends on the severity of the condition. Your treatment may include:  Rest, ice, and over-the-counter pain medicines to manage your pain.  Exercises to stretch your calves and your plantar fascia.  A splint that holds your foot in a stretched, upward position while you sleep (night splint).  Physical therapy to relieve symptoms and prevent problems in the future.  Cortisone injections to relieve severe pain.  Extracorporeal shock wave therapy (ESWT) to stimulate damaged plantar fascia with electrical impulses. It is often used  as a last resort before surgery.  Surgery, if other treatments have not worked after 12 months. HOME CARE INSTRUCTIONS  Take medicines only as  directed by your health care provider.  Avoid activities that cause pain.  Roll the bottom of your foot over a bag of ice or a bottle of cold water. Do this for 20 minutes, 3-4 times a day.  Perform simple stretches as directed by your health care provider.  Try wearing athletic shoes with air-sole or gel-sole cushions or soft shoe inserts.  Wear a night splint while sleeping, if directed by your health care provider.  Keep all follow-up appointments with your health care provider. PREVENTION   Do not perform exercises or activities that cause heel pain.  Consider finding low-impact activities if you continue to have problems.  Lose weight if you need to. The best way to prevent plantar fasciitis is to avoid the activities that aggravate your plantar fascia. SEEK MEDICAL CARE IF:  Your symptoms do not go away after treatment with home care measures.  Your pain gets worse.  Your pain affects your ability to move or do your daily activities. This information is not intended to replace advice given to you by your health care provider. Make sure you discuss any questions you have with your health care provider. Document Released: 01/21/2001 Document Revised: 08/20/2015 Document Reviewed: 03/08/2014 Elsevier Interactive Patient Education  2017 [employer].  Plantar Fasciitis Rehab Ask your health care provider which exercises are safe for you. Do exercises exactly as told by your health care provider and adjust them as directed. It is normal to feel mild stretching, pulling, tightness, or discomfort as you do these exercises, but you should stop right away if you feel sudden pain or your pain gets worse. Do not begin these exercises until told by your health care provider. Stretching and range of motion exercises These exercises warm up your muscles and joints and improve the movement and flexibility of your foot. These exercises also help to relieve pain. Exercise A: Plantar fascia  stretch 1. Sit with your left / right leg crossed over your opposite knee. 2. Hold your heel with one hand with that thumb near your arch. With your other hand, hold your toes and gently pull them back toward the top of your foot. You should feel a stretch on the bottom of your toes or your foot or both. 3. Hold this stretch for__________ seconds. 4. Slowly release your toes and return to the starting position. Repeat __________ times. Complete this exercise __________ times a day. Exercise B: Gastroc, standing 1. Stand with your hands against a wall. 2. Extend your left / right leg behind you, and bend your front knee slightly. 3. Keeping your heels on the floor and keeping your back knee straight, shift your weight toward the wall without arching your back. You should feel a gentle stretch in your left / right calf. 4. Hold this position for __________ seconds. Repeat __________ times. Complete this exercise __________ times a day. Exercise C: Soleus, standing 1. Stand with your hands against a wall. 2. Extend your left / right leg behind you, and bend your front knee slightly. 3. Keeping your heels on the floor, bend your back knee and slightly shift your weight over the back leg. You should feel a gentle stretch deep in your calf. 4. Hold this position for __________ seconds. Repeat __________ times. Complete this exercise __________ times a day. Exercise D: Gastrocsoleus, standing 1. Stand with  the ball of your left / right foot on a step. The ball of your foot is on the walking surface, right under your toes. 2. Keep your other foot firmly on the same step. 3. Hold onto the wall or a railing for balance. 4. Slowly lift your other foot, allowing your body weight to press your heel down over the edge of the step. You should feel a stretch in your left / right calf. 5. Hold this position for __________ seconds. 6. Return both feet to the step. 7. Repeat this exercise with a slight bend in  your left / right knee. Repeat __________ times with your left / right knee straight and __________ times with your left / right knee bent. Complete this exercise __________ times a day. Balance exercise This exercise builds your balance and strength control of your arch to help take pressure off your plantar fascia. Exercise E: Single leg stand 1. Without shoes, stand near a railing or in a doorway. You may hold onto the railing or door frame as needed. 2. Stand on your left / right foot. Keep your big toe down on the floor and try to keep your arch lifted. Do not let your foot roll inward. 3. Hold this position for __________ seconds. 4. If this exercise is too easy, you can try it with your eyes closed or while standing on a pillow. Repeat __________ times. Complete this exercise __________ times a day. This information is not intended to replace advice given to you by your health care provider. Make sure you discuss any questions you have with your health care provider. Document Released: 04/28/2005 Document Revised: 01/01/2016 Document Reviewed: 03/12/2015 Elsevier Interactive Patient Education  2017 [employer].    IF you received an x-ray today, you will receive an invoice from [facility]. Please contact [facility] at [phone] with questions or concerns regarding your invoice.   IF you received labwork today, you will receive an invoice from [facility]. Please contact [facility] at [phone] with questions or concerns regarding your invoice.   Our billing staff will not be able to assist you with questions regarding bills from these companies.  You will be contacted with the lab results as soon as they are available. The fastest way to get your results is to activate your My Chart account. Instructions are located on the last page of this paperwork. If you have not heard from [place] regarding the results in 2 weeks, please contact this office.       I  personally performed the services described in this documentation, which was scribed in my presence. The recorded information has been reviewed and considered, and addended by me as needed.   Signed,   Merri Ray, MD [facility].  05/26/16 5:13 PM

## 2016-05-26 NOTE — Patient Instructions (Addendum)
Your foot pain does appear to be plantar fasciitis. See information below and try the stretches that I discussed in the office.  Your symptoms do appear to be due to a virus, but will let you know this afternoon if the flu test is positive. If so, I do not mind sending in Tamiflu liquid to your pharmacy. Tylenol or Motrin if needed for fever, out of work until fever free for 24 hours.   If abdominal pain is not improving later this week or any worsening symptoms sooner, return for recheck.   Plantar Fasciitis Plantar fasciitis is a painful foot condition that affects the heel. It occurs when the band of tissue that connects the toes to the heel bone (plantar fascia) becomes irritated. This can happen after exercising too much or doing other repetitive activities (overuse injury). The pain from plantar fasciitis can range from mild irritation to severe pain that makes it difficult for you to walk or move. The pain is usually worse in the morning or after you have been sitting or lying down for a while. CAUSES This condition may be caused by:  Standing for long periods of time.  Wearing shoes that do not fit.  Doing high-impact activities, including running, aerobics, and ballet.  Being overweight.  Having an abnormal way of walking (gait).  Having tight calf muscles.  Having high arches in your feet.  Starting a new athletic activity. SYMPTOMS The main symptom of this condition is heel pain. Other symptoms include:  Pain that gets worse after activity or exercise.  Pain that is worse in the morning or after resting.  Pain that goes away after you walk for a few minutes. DIAGNOSIS This condition may be diagnosed based on your signs and symptoms. Your health care provider will also do a physical exam to check for:  A tender area on the bottom of your foot.  A high arch in your foot.  Pain when you move your foot.  Difficulty moving your foot. You may also need to have  imaging studies to confirm the diagnosis. These can include:  X-rays.  Ultrasound.  MRI. TREATMENT  Treatment for plantar fasciitis depends on the severity of the condition. Your treatment may include:  Rest, ice, and over-the-counter pain medicines to manage your pain.  Exercises to stretch your calves and your plantar fascia.  A splint that holds your foot in a stretched, upward position while you sleep (night splint).  Physical therapy to relieve symptoms and prevent problems in the future.  Cortisone injections to relieve severe pain.  Extracorporeal shock wave therapy (ESWT) to stimulate damaged plantar fascia with electrical impulses. It is often used as a last resort before surgery.  Surgery, if other treatments have not worked after 12 months. HOME CARE INSTRUCTIONS  Take medicines only as directed by your health care provider.  Avoid activities that cause pain.  Roll the bottom of your foot over a bag of ice or a bottle of cold water. Do this for 20 minutes, 3-4 times a day.  Perform simple stretches as directed by your health care provider.  Try wearing athletic shoes with air-sole or gel-sole cushions or soft shoe inserts.  Wear a night splint while sleeping, if directed by your health care provider.  Keep all follow-up appointments with your health care provider. PREVENTION   Do not perform exercises or activities that cause heel pain.  Consider finding low-impact activities if you continue to have problems.  Lose weight if you need  to. The best way to prevent plantar fasciitis is to avoid the activities that aggravate your plantar fascia. SEEK MEDICAL CARE IF:  Your symptoms do not go away after treatment with home care measures.  Your pain gets worse.  Your pain affects your ability to move or do your daily activities. This information is not intended to replace advice given to you by your health care provider. Make sure you discuss any questions you  have with your health care provider. Document Released: 01/21/2001 Document Revised: 08/20/2015 Document Reviewed: 03/08/2014 Elsevier Interactive Patient Education  2017 [employer].  Plantar Fasciitis Rehab Ask your health care provider which exercises are safe for you. Do exercises exactly as told by your health care provider and adjust them as directed. It is normal to feel mild stretching, pulling, tightness, or discomfort as you do these exercises, but you should stop right away if you feel sudden pain or your pain gets worse. Do not begin these exercises until told by your health care provider. Stretching and range of motion exercises These exercises warm up your muscles and joints and improve the movement and flexibility of your foot. These exercises also help to relieve pain. Exercise A: Plantar fascia stretch 1. Sit with your left / right leg crossed over your opposite knee. 2. Hold your heel with one hand with that thumb near your arch. With your other hand, hold your toes and gently pull them back toward the top of your foot. You should feel a stretch on the bottom of your toes or your foot or both. 3. Hold this stretch for__________ seconds. 4. Slowly release your toes and return to the starting position. Repeat __________ times. Complete this exercise __________ times a day. Exercise B: Gastroc, standing 1. Stand with your hands against a wall. 2. Extend your left / right leg behind you, and bend your front knee slightly. 3. Keeping your heels on the floor and keeping your back knee straight, shift your weight toward the wall without arching your back. You should feel a gentle stretch in your left / right calf. 4. Hold this position for __________ seconds. Repeat __________ times. Complete this exercise __________ times a day. Exercise C: Soleus, standing 1. Stand with your hands against a wall. 2. Extend your left / right leg behind you, and bend your front knee  slightly. 3. Keeping your heels on the floor, bend your back knee and slightly shift your weight over the back leg. You should feel a gentle stretch deep in your calf. 4. Hold this position for __________ seconds. Repeat __________ times. Complete this exercise __________ times a day. Exercise D: Gastrocsoleus, standing 1. Stand with the ball of your left / right foot on a step. The ball of your foot is on the walking surface, right under your toes. 2. Keep your other foot firmly on the same step. 3. Hold onto the wall or a railing for balance. 4. Slowly lift your other foot, allowing your body weight to press your heel down over the edge of the step. You should feel a stretch in your left / right calf. 5. Hold this position for __________ seconds. 6. Return both feet to the step. 7. Repeat this exercise with a slight bend in your left / right knee. Repeat __________ times with your left / right knee straight and __________ times with your left / right knee bent. Complete this exercise __________ times a day. Balance exercise This exercise builds your balance and strength control of  your arch to help take pressure off your plantar fascia. Exercise E: Single leg stand 1. Without shoes, stand near a railing or in a doorway. You may hold onto the railing or door frame as needed. 2. Stand on your left / right foot. Keep your big toe down on the floor and try to keep your arch lifted. Do not let your foot roll inward. 3. Hold this position for __________ seconds. 4. If this exercise is too easy, you can try it with your eyes closed or while standing on a pillow. Repeat __________ times. Complete this exercise __________ times a day. This information is not intended to replace advice given to you by your health care provider. Make sure you discuss any questions you have with your health care provider. Document Released: 04/28/2005 Document Revised: 01/01/2016 Document Reviewed: 03/12/2015 Elsevier  Interactive Patient Education  2017 [employer].    IF you received an x-ray today, you will receive an invoice from [facility]. Please contact [facility] at [phone] with questions or concerns regarding your invoice.   IF you received labwork today, you will receive an invoice from [facility]. Please contact [facility] at [phone] with questions or concerns regarding your invoice.   Our billing staff will not be able to assist you with questions regarding bills from these companies.  You will be contacted with the lab results as soon as they are available. The fastest way to get your results is to activate your My Chart account. Instructions are located on the last page of this paperwork. If you have not heard from [place] regarding the results in 2 weeks, please contact this office.

## 2016-06-02 ENCOUNTER — Ambulatory Visit (INDEPENDENT_AMBULATORY_CARE_PROVIDER_SITE_OTHER): Payer: BLUE CROSS/BLUE SHIELD | Admitting: Physician Assistant

## 2016-06-02 ENCOUNTER — Other Ambulatory Visit: Payer: Self-pay | Admitting: Physician Assistant

## 2016-06-02 ENCOUNTER — Ambulatory Visit (HOSPITAL_COMMUNITY)
Admission: RE | Admit: 2016-06-02 | Discharge: 2016-06-02 | Disposition: A | Discharge status: Home / Self Care | Payer: BLUE CROSS/BLUE SHIELD | Source: Ambulatory Visit | Attending: Physician Assistant | Admitting: Physician Assistant

## 2016-06-02 ENCOUNTER — Encounter: Payer: Self-pay | Admitting: Physician Assistant

## 2016-06-02 VITALS — BP 131/82 | HR 112 | Temp 99.6°F | Ht 70.5 in | Wt 224.8 lb

## 2016-06-02 DIAGNOSIS — R1032 Left lower quadrant pain: Secondary | ICD-10-CM

## 2016-06-02 DIAGNOSIS — K5792 Diverticulitis of intestine, part unspecified, without perforation or abscess without bleeding: Secondary | ICD-10-CM | POA: Diagnosis not present

## 2016-06-02 DIAGNOSIS — K769 Liver disease, unspecified: Secondary | ICD-10-CM | POA: Insufficient documentation

## 2016-06-02 DIAGNOSIS — K5730 Diverticulosis of large intestine without perforation or abscess without bleeding: Secondary | ICD-10-CM

## 2016-06-02 MED ORDER — CIPROFLOXACIN 500 MG/5ML (10%) PO SUSR: 750.0000 mg | Freq: Two times a day (BID) | ORAL | 0 refills | Status: DC

## 2016-06-02 MED ORDER — METRONIDAZOLE 50 MG/ML ORAL SUSPENSION: 500.0000 mg | Freq: Four times a day (QID) | ORAL | 0 refills | Status: DC

## 2016-06-02 MED ORDER — IOPAMIDOL (ISOVUE-300) INJECTION 61%
100.0000 mL | Freq: Once | INTRAVENOUS | Status: AC | PRN
  Administered 2016-06-02: 100 mL via INTRAVENOUS

## 2016-06-02 NOTE — Progress Notes (Signed)
06/02/2016 6:45 PM   DOB: 01-26-1971 / MRN: [id number]  SUBJECTIVE:  Jesse Steele is a 46 y.o. male presenting for left sided lower abdominal pain that is stabbing in nature. This started three days ago. He has a history of diverticulosis diagnosis outside of [facility] in 2010 found on colonoscopy.  REports eating some popcorn seeds and thinks this has caused a flare.  He did have some constipation at 10 pm last night however this improved with miralax as he had a non bloody stool this morning. He tells me that he has mostly lost his appetite.   He is allergic to codeine; penicillins; and sulfa antibiotics.   He  has a past medical history of ADHD (attention deficit hyperactivity disorder); Anxiety; Diverticulosis; Esophageal stricture; GERD (gastroesophageal reflux disease); Liver lesion; Low testosterone; and Lumbar degenerative disc disease.    He  reports that he has never smoked. He has never used smokeless tobacco. He reports that he drinks about 1.0 oz of alcohol per week . He reports that he does not use drugs. He  reports that he currently engages in sexual activity and has had male partners. The patient  has a past surgical history that includes Umbilical hernia repair; Tonsillectomy; Adenoidectomy; Esophagogastroduodenoscopy (11/03/2011); Savory dilation (11/03/2011); and Upper gastrointestinal endoscopy.  His family history includes COPD in his mother; Heart disease in his maternal grandmother and mother; Kidney disease in his paternal grandmother; Prostate cancer in his father.  Review of Systems  Constitutional: Positive for chills. Negative for diaphoresis, fever and malaise/fatigue.  Respiratory: Negative for cough.   Cardiovascular: Negative for chest pain.  Gastrointestinal: Positive for abdominal pain. Negative for blood in stool, constipation, diarrhea, heartburn, melena, nausea and vomiting.  Genitourinary: Negative for dysuria, flank pain, frequency, hematuria and urgency.  Skin:  Negative for itching and rash.  Neurological: Negative for dizziness.    The problem list and medications were reviewed and updated by myself where necessary and exist elsewhere in the encounter.   OBJECTIVE:  BP 131/82 (BP Location: Right Arm, Patient Position: Sitting, Cuff Size: Large)   Pulse (!) 112   Temp 99.6 F (37.6 C) (Oral)   Ht 5' 10.5" (1.791 m)   Wt 224 lb 12.8 oz (102 kg)   SpO2 96%   BMI 31.80 kg/m   Physical Exam  Constitutional: He is oriented to person, place, and time. He appears well-developed and well-nourished. No distress.  Cardiovascular: Regular rhythm, normal heart sounds and intact distal pulses.  Exam reveals no gallop and no friction rub.   No murmur heard. Pulmonary/Chest: Effort normal and breath sounds normal. No respiratory distress. He has no wheezes. He has no rales. He exhibits no tenderness.  Abdominal: Soft. Bowel sounds are normal. He exhibits no distension and no mass. There is tenderness (LLQ with guarding). There is no rebound and no guarding.  Musculoskeletal: Normal range of motion.  Neurological: He is alert and oriented to person, place, and time. He displays normal reflexes. No cranial nerve deficit. He exhibits normal muscle tone. Coordination normal.  Skin: Skin is warm and dry. He is not diaphoretic.    Pulse Readings from Last 3 Encounters:  06/02/16 (!) 112  05/26/16 95  05/02/16 67   Lab Results  Component Value Date   CREATININE 0.89 03/20/2015      No results found for this or any previous visit (from the past 72 hour(s)).  No results found.  ASSESSMENT AND PLAN:  [name] was seen today for abdominal pain.  Diagnoses and all orders for this visit:  Abdominal pain, left lower quadrant: Given his presentation and history I am concerned for diverticulitis.  Will rule out perf with CT.  If no perf will see him back in 2 days for recheck.  -     CBC -     ciprofloxacin (CIPRO) 500 MG/5ML (10%) suspension; Take 7.5  mLs (750 mg total) by mouth 2 (two) times daily. -     metroNIDAZOLE (FLAGYL) 50 mg/ml oral suspension; Take 10 mLs (500 mg total) by mouth 4 (four) times daily. -     CT ABDOMEN PELVIS W CONTRAST; Future  Diverticulosis of colon without hemorrhage: Historical diagnosis not available in [facility].     The patient is advised to call or return to clinic if he does not see an improvement in symptoms, or to seek the care of the closest emergency department if he worsens with the above plan.   Philis Fendt, MHS, PA-C [facility] 06/02/2016 6:45 PM

## 2016-06-02 NOTE — Patient Instructions (Addendum)
  GO TO [name] [address], [place], [place] [address]    IF you received an x-ray today, you will receive an invoice from [facility]. Please contact [facility] at [phone] with questions or concerns regarding your invoice.   IF you received labwork today, you will receive an invoice from [facility]. Please contact [facility] at [phone] with questions or concerns regarding your invoice.   Our billing staff will not be able to assist you with questions regarding bills from these companies.  You will be contacted with the lab results as soon as they are available. The fastest way to get your results is to activate your My Chart account. Instructions are located on the last page of this paperwork. If you have not heard from [place] regarding the results in 2 weeks, please contact this office.

## 2016-06-03 ENCOUNTER — Telehealth: Payer: Self-pay

## 2016-06-03 ENCOUNTER — Encounter: Payer: Self-pay | Admitting: Physician Assistant

## 2016-06-03 LAB — CBC
Hematocrit: 43.5 % (ref 37.5–51.0)
Hemoglobin: 15.2 g/dL (ref 13.0–17.7)
MCH: 29.5 pg (ref 26.6–33.0)
MCHC: 34.9 g/dL (ref 31.5–35.7)
MCV: 84 fL (ref 79–97)
PLATELETS: 260 10*3/uL (ref 150–379)
RBC: 5.16 x10E6/uL (ref 4.14–5.80)
RDW: 12.9 % (ref 12.3–15.4)
WBC: 11.4 10*3/uL — ABNORMAL HIGH (ref 3.4–10.8)

## 2016-06-03 NOTE — Telephone Encounter (Signed)
Substitution authorized for pills vs liquid

## 2016-06-03 NOTE — Telephone Encounter (Signed)
Michael    No one has the liquid form of Flagl.   Willing to take tablet form and crush it up.  [phone]

## 2016-06-03 NOTE — Telephone Encounter (Signed)
Patient would like to have Jesse Steele give him a call about his lab results. His call back number is [phone]

## 2016-06-04 ENCOUNTER — Ambulatory Visit (INDEPENDENT_AMBULATORY_CARE_PROVIDER_SITE_OTHER): Payer: BLUE CROSS/BLUE SHIELD | Admitting: Family Medicine

## 2016-06-04 VITALS — BP 142/80 | HR 81 | Temp 99.2°F | Resp 17 | Ht 70.5 in | Wt 224.0 lb

## 2016-06-04 DIAGNOSIS — K5732 Diverticulitis of large intestine without perforation or abscess without bleeding: Secondary | ICD-10-CM | POA: Diagnosis not present

## 2016-06-04 NOTE — Patient Instructions (Addendum)
Continue Ciprofloxacin as prescribed for a total of 6 remaining days.  Discontinue Flagyl until 06/06/16 to prevent alcohol interaction with medication. Resume medication on Friday morning. Take for a total of 7 days.  Return for follow-up if you experience any other symptoms.  Thanks,  Carroll Sage. Kenton Kingfisher, MSN, FNP-C [facility] [place] [phone]    IF you received an x-ray today, you will receive an invoice from [facility]. Please contact [facility] at [phone] with questions or concerns regarding your invoice.   IF you received labwork today, you will receive an invoice from [facility]. Please contact [facility] at [phone] with questions or concerns regarding your invoice.   Our billing staff will not be able to assist you with questions regarding bills from these companies.  You will be contacted with the lab results as soon as they are available. The fastest way to get your results is to activate your My Chart account. Instructions are located on the last page of this paperwork. If you have not heard from [place] regarding the results in 2 weeks, please contact this office.

## 2016-06-04 NOTE — Progress Notes (Signed)
Patient ID: Jesse Steele, male    DOB: 1970/10/04, 46 y.o.   MRN: [id number]  PCP: Suzanna Obey, MD  Chief Complaint  Patient presents with  . Medication Problem    Subjective:  HPI 46 year old male presents for follow-up post dx acute diverticulitis  Stomach feels better, no diarrhea. Tolerated solid meal today without nausea or abdominal upset. Regular large bowel movement today. One issues, once taking his metronidazole and Ciprofloxacin he experienced a "jittery abnormal sensation". Reports alcohol intake of wine within the last 24 hours of taking metronidazole. He was unaware that he should not ingest alcohol with medication.  Social History   Social History  . Marital status: Married    Spouse name: N/A  . Number of children: 1  . Years of education: N/A   Occupational History  . Sales    Social History Main Topics  . Smoking status: Never Smoker  . Smokeless tobacco: Never Used  . Alcohol use 1.0 oz/week    2 Standard drinks or equivalent per week     Comment: 3 Drinks per week  . Drug use: No  . Sexual activity: Yes    Partners: Female   Other Topics Concern  . Not on file   Social History Narrative   Daily caffeine     Family History  Problem Relation Age of Onset  . Prostate cancer Father   . COPD Mother   . Heart disease Mother   . Heart disease Maternal Grandmother   . Kidney disease Paternal Grandmother   . Colon cancer Neg Hx    Review of Systems HPI Patient Active Problem List   Diagnosis Date Noted  . GERD (gastroesophageal reflux disease) 05/27/2012  . Liver lesion 05/27/2012  . Stricture and stenosis of esophagus 11/03/2011  . Other dysphagia 11/03/2011  . ADHD (attention deficit hyperactivity disorder) 10/30/2011    Allergies  Allergen Reactions  . Codeine Other (See Comments)    Reaction unknown (family allergy)  . Penicillins Nausea Only  . Sulfa Antibiotics Nausea Only    Prior to Admission medications   Medication Sig  Start Date End Date Taking? Authorizing Provider  amphetamine-dextroamphetamine (ADDERALL XR) 20 MG 24 hr capsule Take 20 mg by mouth. 05/16/14  Yes Historical Provider, MD  ciprofloxacin (CIPRO) 500 MG/5ML (10%) suspension Take 7.5 mLs (750 mg total) by mouth 2 (two) times daily. 06/02/16  Yes Tereasa Coop, PA-C  metroNIDAZOLE (FLAGYL) 50 mg/ml oral suspension Take 10 mLs (500 mg total) by mouth 4 (four) times daily. 06/02/16  Yes Tereasa Coop, PA-C    Past Medical, Surgical Family and Social History reviewed and updated.    Objective:   Today's Vitals   06/04/16 1542  BP: (!) 142/80  Pulse: 81  Resp: 17  Temp: 99.2 F (37.3 C)  TempSrc: Oral  SpO2: 99%  Weight: 224 lb (101.6 kg)  Height: 5' 10.5" (1.791 m)    Wt Readings from Last 3 Encounters:  06/04/16 224 lb (101.6 kg)  06/02/16 224 lb 12.8 oz (102 kg)  05/26/16 227 lb (103 kg)   Physical Exam  Constitutional: He is oriented to person, place, and time. He appears well-developed and well-nourished.  Cardiovascular: Normal rate, regular rhythm, normal heart sounds and intact distal pulses.   Pulmonary/Chest: Effort normal and breath sounds normal.  Abdominal: Soft. Bowel sounds are normal. He exhibits no distension and no mass. There is no tenderness. There is no rebound and no guarding.  Neurological:  He is alert and oriented to person, place, and time.  Skin: Skin is warm and dry.  Psychiatric: He has a normal mood and affect. His behavior is normal. Judgment and thought content normal.      Assessment & Plan:  1. Diverticulitis of colon without hemorrhage - CBC with Differential- unremarkable   Take medication as follows:  Continue Ciprofloxacin as prescribed for a total of 6 remaining days.  Discontinue Flagyl until 06/06/16 to prevent alcohol interaction with medication. Resume medication on Friday morning. Take for a total of 7 days.   Carroll Sage. Kenton Kingfisher, MSN, FNP-C [facility]

## 2016-06-05 LAB — CBC WITH DIFFERENTIAL/PLATELET
BASOS ABS: 0.1 10*3/uL (ref 0.0–0.2)
Basos: 1 %
EOS (ABSOLUTE): 0.2 10*3/uL (ref 0.0–0.4)
EOS: 2 %
HEMATOCRIT: 42.7 % (ref 37.5–51.0)
Hemoglobin: 14.1 g/dL (ref 13.0–17.7)
IMMATURE GRANULOCYTES: 1 %
Immature Grans (Abs): 0.1 10*3/uL (ref 0.0–0.1)
Lymphocytes Absolute: 2.4 10*3/uL (ref 0.7–3.1)
Lymphs: 25 %
MCH: 28 pg (ref 26.6–33.0)
MCHC: 33 g/dL (ref 31.5–35.7)
MCV: 85 fL (ref 79–97)
MONOS ABS: 0.6 10*3/uL (ref 0.1–0.9)
Monocytes: 7 %
NEUTROS PCT: 64 %
Neutrophils Absolute: 6.2 10*3/uL (ref 1.4–7.0)
PLATELETS: 278 10*3/uL (ref 150–379)
RBC: 5.03 x10E6/uL (ref 4.14–5.80)
RDW: 13.4 % (ref 12.3–15.4)
WBC: 9.4 10*3/uL (ref 3.4–10.8)

## 2016-06-11 DIAGNOSIS — N401 Enlarged prostate with lower urinary tract symptoms: Secondary | ICD-10-CM | POA: Diagnosis not present

## 2016-06-17 DIAGNOSIS — N5201 Erectile dysfunction due to arterial insufficiency: Secondary | ICD-10-CM | POA: Diagnosis not present

## 2016-06-17 DIAGNOSIS — N401 Enlarged prostate with lower urinary tract symptoms: Secondary | ICD-10-CM | POA: Diagnosis not present

## 2016-06-17 DIAGNOSIS — R350 Frequency of micturition: Secondary | ICD-10-CM | POA: Diagnosis not present

## 2016-07-17 DIAGNOSIS — F900 Attention-deficit hyperactivity disorder, predominantly inattentive type: Secondary | ICD-10-CM | POA: Diagnosis not present

## 2016-08-19 ENCOUNTER — Emergency Department (HOSPITAL_COMMUNITY)
Admission: EM | Admit: 2016-08-19 | Discharge: 2016-08-19 | Disposition: A | Discharge status: Home / Self Care | Payer: BLUE CROSS/BLUE SHIELD | Attending: Emergency Medicine | Admitting: Emergency Medicine

## 2016-08-19 ENCOUNTER — Encounter (HOSPITAL_COMMUNITY): Payer: Self-pay | Admitting: *Deleted

## 2016-08-19 DIAGNOSIS — Y9389 Activity, other specified: Secondary | ICD-10-CM | POA: Diagnosis not present

## 2016-08-19 DIAGNOSIS — F909 Attention-deficit hyperactivity disorder, unspecified type: Secondary | ICD-10-CM | POA: Insufficient documentation

## 2016-08-19 DIAGNOSIS — Y929 Unspecified place or not applicable: Secondary | ICD-10-CM | POA: Insufficient documentation

## 2016-08-19 DIAGNOSIS — S60424A Blister (nonthermal) of right ring finger, initial encounter: Secondary | ICD-10-CM | POA: Diagnosis not present

## 2016-08-19 DIAGNOSIS — J029 Acute pharyngitis, unspecified: Secondary | ICD-10-CM | POA: Diagnosis not present

## 2016-08-19 DIAGNOSIS — M79644 Pain in right finger(s): Secondary | ICD-10-CM | POA: Diagnosis not present

## 2016-08-19 DIAGNOSIS — W228XXA Striking against or struck by other objects, initial encounter: Secondary | ICD-10-CM | POA: Insufficient documentation

## 2016-08-19 DIAGNOSIS — Y999 Unspecified external cause status: Secondary | ICD-10-CM | POA: Insufficient documentation

## 2016-08-19 DIAGNOSIS — S6992XA Unspecified injury of left wrist, hand and finger(s), initial encounter: Secondary | ICD-10-CM | POA: Diagnosis present

## 2016-08-19 MED ORDER — MINERAL OIL PO OIL
TOPICAL_OIL | Freq: Once | ORAL | Status: DC
  Filled 2016-08-19: qty 30

## 2016-08-19 NOTE — ED Triage Notes (Signed)
Pt has ring stuck on finger. Pt has tried soap and water to remove ring with no success.

## 2016-08-19 NOTE — ED Triage Notes (Signed)
PT C/O PAIN AND SWELLING TO THE 3RD MIDDLE FINGER SINCE 5 PM. PT STS HE PUT THE RING ON ABOUT 3 PM, AND BY 5 PM HIS FINGER BEGAN TO SWELL, BUT HE COULD NOT REMOVE IT.

## 2016-08-19 NOTE — ED Notes (Signed)
Pt left before receiving discharge paperwork. 

## 2016-08-19 NOTE — ED Provider Notes (Signed)
Jesse Steele   CSN: [id number] Arrival date & time: 08/19/16  2003  By signing my name below, I, Jesse Steele, attest that this documentation has been prepared under the direction and in the presence of Jesse Steele . Electronically Signed: Higinio Steele, Scribe. 08/19/2016. 10:26 PM.  History   Chief Complaint Chief Complaint  Patient presents with  . RING STUCK ON FINGER    R 3RD   The history is provided by the patient. No language interpreter was used.   HPI Comments: Jesse Steele is a 46 y.o. male who presents to the Emergency Department for an evaluation of gradually worsening pain and swelling to his right third digit s/p "getting a ring stuck" on his finger ~7 hours PTA. Pt reports he accidentally placed his ring on "the wrong finger" and noticed worsening swelling to his third digit ~2 hours later. He states he was unable to remove the ring on his own due to the swelling and noticed an associated "blister" to the palm of his hand after multiple attempts to take his ring off. Ring removed by nursing staff in the ED prior to evaluation. Pt reports the pain and swelling to his right third digit have now improved and are returning to baseline appearance.  Pt denies any other complaints.   Past Medical History:  Diagnosis Date  . ADHD (attention deficit hyperactivity disorder)   . Anxiety   . Diverticulosis   . Esophageal stricture   . GERD (gastroesophageal reflux disease)   . Liver lesion   . Low testosterone   . Lumbar degenerative disc disease     Patient Active Problem List   Diagnosis Date Noted  . GERD (gastroesophageal reflux disease) 05/27/2012  . Liver lesion 05/27/2012  . Stricture and stenosis of esophagus 11/03/2011  . Other dysphagia 11/03/2011  . ADHD (attention deficit hyperactivity disorder) 10/30/2011    Past Surgical History:  Procedure Laterality Date  . ADENOIDECTOMY    . ESOPHAGOGASTRODUODENOSCOPY  11/03/2011   Procedure:  ESOPHAGOGASTRODUODENOSCOPY (EGD);  Surgeon: Jesse Dragon, MD;  Location: [facility];  Service: Endoscopy;  Laterality: N/A;  . SAVORY DILATION  11/03/2011   Procedure: SAVORY DILATION;  Surgeon: Jesse Dragon, MD;  Location: [facility];  Service: Endoscopy;  Laterality: N/A;  . TONSILLECTOMY    . UMBILICAL HERNIA REPAIR    . UPPER GASTROINTESTINAL ENDOSCOPY      Home Medications    Prior to Admission medications   Medication Sig Start Date End Date Taking? Authorizing Provider  amphetamine-dextroamphetamine (ADDERALL XR) 20 MG 24 hr capsule Take 20 mg by mouth. 05/16/14   Historical Provider, MD  ciprofloxacin (CIPRO) 500 MG/5ML (10%) suspension Take 7.5 mLs (750 mg total) by mouth 2 (two) times daily. 06/02/16   Jesse Steele, Steele  metroNIDAZOLE (FLAGYL) 50 mg/ml oral suspension Take 10 mLs (500 mg total) by mouth 4 (four) times daily. 06/02/16   Jesse Steele, Steele    Family History Family History  Problem Relation Age of Onset  . Prostate cancer Father   . COPD Mother   . Heart disease Mother   . Heart disease Maternal Grandmother   . Kidney disease Paternal Grandmother   . Colon cancer Neg Hx     Social History Social History  Substance Use Topics  . Smoking status: Never Smoker  . Smokeless tobacco: Never Used  . Alcohol use 1.0 oz/week    2 Standard drinks or equivalent per week  Comment: 3 Drinks per week   Allergies   Codeine; Penicillins; and Sulfa antibiotics  Review of Systems Review of Systems  Constitutional: Negative for fever.  Musculoskeletal: Positive for joint swelling (right third digit ).   Physical Exam Updated Vital Signs BP (!) 144/102 (BP Location: Left Arm)   Pulse 91   Resp 16   Ht 5\' 10"  (1.778 m)   Wt 223 lb 5 oz (101.3 kg)   SpO2 99%   BMI 32.04 kg/m   Physical Exam  Constitutional: He appears well-developed and well-nourished. No distress.  HENT:  Head: Normocephalic and atraumatic.  Neck: Neck supple.  Cardiovascular:  Normal rate, regular rhythm and normal heart sounds.   No murmur heard. Pulmonary/Chest: Effort normal and breath sounds normal. No respiratory distress. He has no wheezes. He has no rales.  Musculoskeletal: Normal range of motion.  Right hand with full ROM. 2+ radial pulse. Redness to middle finger c/w irritation. Blister to the palmar aspect just below his PIP joint. Sensation intact.   Neurological: He is alert.  Skin: Skin is warm and dry.  Nursing Steele and vitals reviewed.  ED Treatments / Results  DIAGNOSTIC STUDIES:  Oxygen Saturation is 99% on RA, normal by my interpretation.    COORDINATION OF CARE:  10:14 PM Discussed treatment Steele with pt at bedside and pt agreed to Steele.  Labs (all labs ordered are listed, but only abnormal results are displayed) Labs Reviewed - No data to display  EKG  EKG Interpretation None       Radiology No results found.  Procedures Procedures (including critical care time)  Medications Ordered in ED Medications  mineral oil liquid (not administered)    Initial Impression / Assessment and Steele / ED Course  I have reviewed the triage vital signs and the nursing notes.  Pertinent labs & imaging results that were available during my care of the patient were reviewed by me and considered in my medical decision making (see chart for details).     Jesse Steele is a 46 y.o. male who presents to ED after placing his ring on the wrong finger. Ring was stuck and he was unable to get it off. Ring was removed by nursing staff prior to my evaluation. On my exam, patient states that pain has resolved and swelling improving. He has a good cap refill and sensation is intact. Moving hand without any problems. He does have a blister to the palmar aspect of his finger from trying to get the ring off. Home care instructions discussed. All questions answered.   I personally performed the services described in this documentation, which was scribed in  my presence. The recorded information has been reviewed and is accurate.   Final Clinical Impressions(s) / ED Diagnoses   Final diagnoses:  Blister (nonthermal) of right ring finger, initial encounter    New Prescriptions Discharge Medication List as of 08/19/2016 10:23 PM       [facility] Gracianna Vink, Steele 08/19/16 2245    Dorie Rank, MD 08/20/16 2048

## 2016-08-19 NOTE — Discharge Instructions (Signed)
Keep the skin around blister clean and dry. Return to ER for new or worsening symptoms, any additional concerns.

## 2016-09-22 DIAGNOSIS — J029 Acute pharyngitis, unspecified: Secondary | ICD-10-CM | POA: Diagnosis not present

## 2016-10-09 DIAGNOSIS — F900 Attention-deficit hyperactivity disorder, predominantly inattentive type: Secondary | ICD-10-CM | POA: Diagnosis not present

## 2017-03-09 ENCOUNTER — Ambulatory Visit (INDEPENDENT_AMBULATORY_CARE_PROVIDER_SITE_OTHER): Payer: BLUE CROSS/BLUE SHIELD | Admitting: Family Medicine

## 2017-03-09 ENCOUNTER — Encounter: Payer: Self-pay | Admitting: Family Medicine

## 2017-03-09 VITALS — BP 127/84 | HR 98 | Temp 99.2°F | Resp 16 | Ht 70.0 in | Wt 224.8 lb

## 2017-03-09 DIAGNOSIS — K5732 Diverticulitis of large intestine without perforation or abscess without bleeding: Secondary | ICD-10-CM

## 2017-03-09 DIAGNOSIS — R1032 Left lower quadrant pain: Secondary | ICD-10-CM

## 2017-03-09 LAB — POCT CBC
Granulocyte percent: 77.3 %G (ref 37–80)
HEMATOCRIT: 42.6 % — AB (ref 43.5–53.7)
HEMOGLOBIN: 14.5 g/dL (ref 14.1–18.1)
Lymph, poc: 1.7 (ref 0.6–3.4)
MCH: 29.1 pg (ref 27–31.2)
MCHC: 34 g/dL (ref 31.8–35.4)
MCV: 85.5 fL (ref 80–97)
MID (cbc): 0.4 (ref 0–0.9)
MPV: 7.2 fL (ref 0–99.8)
POC GRANULOCYTE: 7.3 — AB (ref 2–6.9)
POC LYMPH PERCENT: 18.2 %L (ref 10–50)
POC MID %: 4.5 % (ref 0–12)
Platelet Count, POC: 235 10*3/uL (ref 142–424)
RBC: 4.97 M/uL (ref 4.69–6.13)
RDW, POC: 13.7 %
WBC: 9.4 10*3/uL (ref 4.6–10.2)

## 2017-03-09 MED ORDER — METRONIDAZOLE 50 MG/ML ORAL SUSPENSION: 500.0000 mg | Freq: Four times a day (QID) | ORAL | 0 refills | Status: AC

## 2017-03-09 MED ORDER — CIPROFLOXACIN 500 MG/5ML (10%) PO SUSR: 750.0000 mg | Freq: Two times a day (BID) | ORAL | 0 refills | Status: DC

## 2017-03-09 MED ORDER — CIPROFLOXACIN HCL 500 MG PO TABS: 500.0000 mg | ORAL_TABLET | Freq: Two times a day (BID) | ORAL | 0 refills | Status: AC

## 2017-03-09 NOTE — Patient Instructions (Addendum)
IF you received an x-ray today, you will receive an invoice from [facility]. Please contact [facility] at [phone] with questions or concerns regarding your invoice.   IF you received labwork today, you will receive an invoice from [facility]. Please contact [facility] at [phone] with questions or concerns regarding your invoice.   Our billing staff will not be able to assist you with questions regarding bills from these companies.  You will be contacted with the lab results as soon as they are available. The fastest way to get your results is to activate your My Chart account. Instructions are located on the last page of this paperwork. If you have not heard from [place] regarding the results in 2 weeks, please contact this office.     Diverticulitis Diverticulitis is inflammation or infection of small pouches in your colon that form when you have a condition called diverticulosis. The pouches in your colon are called diverticula. Your colon, or large intestine, is where water is absorbed and stool is formed. Complications of diverticulitis can include:  Bleeding.  Severe infection.  Severe pain.  Perforation of your colon.  Obstruction of your colon.  What are the causes? Diverticulitis is caused by bacteria. Diverticulitis happens when stool becomes trapped in diverticula. This allows bacteria to grow in the diverticula, which can lead to inflammation and infection. What increases the risk? People with diverticulosis are at risk for diverticulitis. Eating a diet that does not include enough fiber from fruits and vegetables may make diverticulitis more likely to develop. What are the signs or symptoms? Symptoms of diverticulitis may include:  Abdominal pain and tenderness. The pain is normally located on the left side of the abdomen, but may occur in other areas.  Fever and  chills.  Bloating.  Cramping.  Nausea.  Vomiting.  Constipation.  Diarrhea.  Blood in your stool.  How is this diagnosed? Your health care provider will ask you about your medical history and do a physical exam. You may need to have tests done because many medical conditions can cause the same symptoms as diverticulitis. Tests may include:  Blood tests.  Urine tests.  Imaging tests of the abdomen, including X-rays and CT scans.  When your condition is under control, your health care provider may recommend that you have a colonoscopy. A colonoscopy can show how severe your diverticula are and whether something else is causing your symptoms. How is this treated? Most cases of diverticulitis are mild and can be treated at home. Treatment may include:  Taking over-the-counter pain medicines.  Following a clear liquid diet.  Taking antibiotic medicines by mouth for 7-10 days.  More severe cases may be treated at a hospital. Treatment may include:  Not eating or drinking.  Taking prescription pain medicine.  Receiving antibiotic medicines through an IV tube.  Receiving fluids and nutrition through an IV tube.  Surgery.  Follow these instructions at home:  Follow your health care provider's instructions carefully.  Follow a full liquid diet or other diet as directed by your health care provider. After your symptoms improve, your health care provider may tell you to change your diet. He or she may recommend you eat a high-fiber diet. Fruits and vegetables are good sources of fiber. Fiber makes it easier to pass stool.  Take fiber supplements or probiotics as directed by your health care provider.  Only take medicines as directed by your health care provider.  Keep all your follow-up appointments. Contact a health  care provider if:  Your pain does not improve.  You have a hard time eating food.  Your bowel movements do not return to normal. Get help right away  if:  Your pain becomes worse.  Your symptoms do not get better.  Your symptoms suddenly get worse.  You have a fever.  You have repeated vomiting.  You have bloody or black, tarry stools. This information is not intended to replace advice given to you by your health care provider. Make sure you discuss any questions you have with your health care provider. Document Released: 02/05/2005 Document Revised: 10/04/2015 Document Reviewed: 03/23/2013 Elsevier Interactive Patient Education  2017 [employer].

## 2017-03-09 NOTE — Progress Notes (Signed)
Chief Complaint  Patient presents with  . diverticulitis flare    onset: Friday, 10/10 pain, took some leftover flagyl which helped, pain level 2/10.  Had burning with urination on saturday but no pain today     HPI   He reports that Friday he had left lower quadrant where he always gets his flares He took flagyl that he found because he was having severe pain  He states that his abdominal pain calmed down He states that he was having some pain with urination as well He states that his pain is better from 10/10 to 2/10 He took flagyl 2 times a day to make the prescription last He has changed his meals to just soup He denies fevers or chills since Friday 03/06/17 with temp of 101 No nausea or vomiting No blood in the stools but reports that when he took peptobismol his stools turned back. He had pepperoni pizza at the game on Sunday 03/08/17    Past Medical History:  Diagnosis Date  . ADHD (attention deficit hyperactivity disorder)   . Anxiety   . Diverticulosis   . Esophageal stricture   . GERD (gastroesophageal reflux disease)   . Liver lesion   . Low testosterone   . Lumbar degenerative disc disease     Current Outpatient Prescriptions  Medication Sig Dispense Refill  . amphetamine-dextroamphetamine (ADDERALL XR) 20 MG 24 hr capsule Take 20 mg by mouth.    . metroNIDAZOLE (FLAGYL) 50 mg/ml oral suspension Take 10 mLs (500 mg total) by mouth 4 (four) times daily. 280 mL 0  . ciprofloxacin (CIPRO) 500 MG tablet Take 1 tablet (500 mg total) by mouth 2 (two) times daily. 14 tablet 0   No current facility-administered medications for this visit.     Allergies:  Allergies  Allergen Reactions  . Codeine Other (See Comments)    Reaction unknown (family allergy)  . Penicillins Nausea Only  . Sulfa Antibiotics Nausea Only    Past Surgical History:  Procedure Laterality Date  . ADENOIDECTOMY    . ESOPHAGOGASTRODUODENOSCOPY  11/03/2011   Procedure:  ESOPHAGOGASTRODUODENOSCOPY (EGD);  Surgeon: Lafayette Dragon, MD;  Location: [facility];  Service: Endoscopy;  Laterality: N/A;  . SAVORY DILATION  11/03/2011   Procedure: SAVORY DILATION;  Surgeon: Lafayette Dragon, MD;  Location: [facility];  Service: Endoscopy;  Laterality: N/A;  . TONSILLECTOMY    . UMBILICAL HERNIA REPAIR    . UPPER GASTROINTESTINAL ENDOSCOPY      Social History   Social History  . Marital status: Married    Spouse name: N/A  . Number of children: 1  . Years of education: N/A   Occupational History  . Sales    Social History Main Topics  . Smoking status: Never Smoker  . Smokeless tobacco: Never Used  . Alcohol use 1.0 oz/week    2 Standard drinks or equivalent per week     Comment: 3 Drinks per week  . Drug use: No  . Sexual activity: Yes    Partners: Female   Other Topics Concern  . None   Social History Narrative   Daily caffeine     Family History  Problem Relation Age of Onset  . Prostate cancer Father   . COPD Mother   . Heart disease Mother   . Heart disease Maternal Grandmother   . Kidney disease Paternal Grandmother   . Colon cancer Neg Hx      ROS Review of Systems See HPI Constitution: +fevers ,  no chills No malaise No diaphoresis Skin: No rash or itching Eyes: no blurry vision, no double vision GU: + dysuria, no hematuria GI: no melena or hematochezia Neuro: no dizziness or headaches    Objective: Vitals:   03/09/17 1203  BP: 127/84  Pulse: 98  Resp: 16  Temp: 99.2 F (37.3 C)  TempSrc: Oral  SpO2: 97%  Weight: 224 lb 12.8 oz (102 kg)  Height: 5\' 10"  (1.778 m)    Physical Exam  Constitutional: He is oriented to person, place, and time. He appears well-developed and well-nourished.  HENT:  Head: Normocephalic and atraumatic.  Eyes: Conjunctivae and EOM are normal.  Cardiovascular: Normal rate, regular rhythm and normal heart sounds.   Pulmonary/Chest: Effort normal and breath sounds normal. No respiratory  distress. He has no wheezes.  Abdominal: Normal appearance and bowel sounds are normal. There is tenderness in the left lower quadrant. There is no rigidity, no rebound, no guarding, no CVA tenderness, no tenderness at McBurney's point and negative Murphy's sign.    Musculoskeletal: Normal range of motion. He exhibits no edema.  Neurological: He is alert and oriented to person, place, and time.  Psychiatric: He has a normal mood and affect. His behavior is normal. Judgment and thought content normal.   Assessment and Plan Blessed was seen today for diverticulitis flare.  Diagnoses and all orders for this visit:  Diverticulitis of colon without hemorrhage- continue flagyl and cipro -     POCT CBC -     Comprehensive metabolic panel  Abdominal pain, left lower quadrant- no UTI, likely diverticulitis Discussed that he should follow up if his pain does not  Improve He currently has a normal white blood cell count He should go to a liquid low fiber diet for now and after the flare resume a high fiber diet Discussed how fiber improved bowel movements and prevents constipations and straining -     POCT CBC -     Comprehensive metabolic panel -     metroNIDAZOLE (FLAGYL) 50 mg/ml oral suspension; Take 10 mLs (500 mg total) by mouth 4 (four) times daily. -     Discontinue: ciprofloxacin (CIPRO) 500 MG/5ML (10%) suspension; Take 7.5 mLs (750 mg total) by mouth 2 (two) times daily. -     POCT urinalysis dipstick -     ciprofloxacin (CIPRO) 500 MG tablet; Take 1 tablet (500 mg total) by mouth 2 (two) times daily.  A total of 25 minutes were spent face-to-face with the patient during this encounter and over half of that time was spent on counseling and coordination of care.    [place]

## 2017-03-10 LAB — COMPREHENSIVE METABOLIC PANEL
A/G RATIO: 1.8 (ref 1.2–2.2)
ALT: 68 IU/L — ABNORMAL HIGH (ref 0–44)
AST: 73 IU/L — ABNORMAL HIGH (ref 0–40)
Albumin: 4.1 g/dL (ref 3.5–5.5)
Alkaline Phosphatase: 70 IU/L (ref 39–117)
BUN/Creatinine Ratio: 11 (ref 9–20)
BUN: 9 mg/dL (ref 6–24)
Bilirubin Total: 0.3 mg/dL (ref 0.0–1.2)
CO2: 26 mmol/L (ref 20–29)
Calcium: 9.5 mg/dL (ref 8.7–10.2)
Chloride: 101 mmol/L (ref 96–106)
Creatinine, Ser: 0.84 mg/dL (ref 0.76–1.27)
GFR, EST AFRICAN AMERICAN: 122 mL/min/{1.73_m2} (ref >59)
GFR, EST NON AFRICAN AMERICAN: 106 mL/min/{1.73_m2} (ref >59)
GLOBULIN, TOTAL: 2.3 g/dL (ref 1.5–4.5)
Glucose: 91 mg/dL (ref 65–99)
POTASSIUM: 4.5 mmol/L (ref 3.5–5.2)
SODIUM: 142 mmol/L (ref 134–144)
TOTAL PROTEIN: 6.4 g/dL (ref 6.0–8.5)

## 2017-03-26 DIAGNOSIS — F900 Attention-deficit hyperactivity disorder, predominantly inattentive type: Secondary | ICD-10-CM | POA: Diagnosis not present

## 2017-06-05 ENCOUNTER — Ambulatory Visit: Payer: BLUE CROSS/BLUE SHIELD | Admitting: Physician Assistant

## 2017-06-05 ENCOUNTER — Encounter: Payer: Self-pay | Admitting: Physician Assistant

## 2017-06-05 VITALS — BP 128/80 | HR 99 | Temp 98.5°F | Resp 17 | Ht 70.0 in | Wt 226.0 lb

## 2017-06-05 DIAGNOSIS — Z8719 Personal history of other diseases of the digestive system: Secondary | ICD-10-CM | POA: Diagnosis not present

## 2017-06-05 DIAGNOSIS — R1032 Left lower quadrant pain: Secondary | ICD-10-CM

## 2017-06-05 MED ORDER — ONDANSETRON HCL 4 MG PO TABS: ORAL_TABLET | ORAL | 0 refills | Status: DC

## 2017-06-05 MED ORDER — AMOXICILLIN-POT CLAVULANATE 875-125 MG PO TABS: 1.0000 | ORAL_TABLET | Freq: Two times a day (BID) | ORAL | 0 refills | Status: DC

## 2017-06-05 NOTE — Patient Instructions (Addendum)
I worried that she may be getting another episode of diverticulitis.  I am going to treat you for that to ensure that you are going to be okay.  There is 2 ways that we treat diverticulitis one is Cipro and Flagyl the other is amoxicillin.  Cipro is a great drug however can come with some side effects particularly tendon rupture and aortic aneurysm or abdominal aneurysm.  I would like to avoid this if at all possible and that is why I am prescribing amoxicillin today I prescribed you some Zofran in the event you become nauseous so you can take that with the amoxicillin and hopefully you will feel well.  Is always nice to see you in clinic.  Please come back if you need anything.  Come back in a week if you are not getting better and come back within the next 24-48 hours if you are getting worse.    IF you received an x-ray today, you will receive an invoice from [facility]. Please contact [facility] at [phone] with questions or concerns regarding your invoice.   IF you received labwork today, you will receive an invoice from [facility]. Please contact [facility] at [phone] with questions or concerns regarding your invoice.   Our billing staff will not be able to assist you with questions regarding bills from these companies.  You will be contacted with the lab results as soon as they are available. The fastest way to get your results is to activate your My Chart account. Instructions are located on the last page of this paperwork. If you have not heard from [place] regarding the results in 2 weeks, please contact this office.

## 2017-06-05 NOTE — Progress Notes (Signed)
06/05/2017 10:23 AM   DOB: June 30, 1970 / MRN: [id number]  SUBJECTIVE:  Jesse Steele is a 47 y.o. male presenting for left lower abdominal quadrant pain times 3 days.  Tells me he is getting worse.  Has a history of documented diverticulitis on CT scan in 2018.  Was seen by Dr. Nolon Steele for similar issue about 4 months ago and was prescribed Cipro Flagyl at that time.  Last EtOH was Saturday evening and this was 2 drinks.  He tells me "I do not think this is my diverticulitis."  Denies bloody stools but says that his stools are "curly Q."  Has a good appetite.  He is allergic to codeine; penicillins; and sulfa antibiotics.   He  has a past medical history of ADHD (attention deficit hyperactivity disorder), Anxiety, Diverticulosis, Esophageal stricture, GERD (gastroesophageal reflux disease), Liver lesion, Low testosterone, and Lumbar degenerative disc disease.    He  reports that  has never smoked. he has never used smokeless tobacco. He reports that he drinks about 1.0 oz of alcohol per week. He reports that he does not use drugs. He  reports that he currently engages in sexual activity and has had partners who are Male. The patient  has a past surgical history that includes Umbilical hernia repair; Tonsillectomy; Adenoidectomy; Esophagogastroduodenoscopy (11/03/2011); Savory dilation (11/03/2011); and Upper gastrointestinal endoscopy.  His family history includes COPD in his mother; Heart disease in his maternal grandmother and mother; Kidney disease in his paternal grandmother; Prostate cancer in his father.  Review of Systems  Constitutional: Negative for chills, diaphoresis and fever.  Eyes: Negative.   Respiratory: Negative for cough, hemoptysis, sputum production, shortness of breath and wheezing.   Cardiovascular: Negative for chest pain, orthopnea and leg swelling.  Gastrointestinal: Negative for blood in stool, constipation, diarrhea, heartburn, melena and nausea.  Skin: Negative  for rash.  Neurological: Negative for dizziness, sensory change, speech change, focal weakness and headaches.    The problem list and medications were reviewed and updated by myself where necessary and exist elsewhere in the encounter.   OBJECTIVE:  BP 128/80   Pulse 99   Temp 98.5 F (36.9 C) (Oral)   Resp 17   Ht 5\' 10"  (1.778 m)   Wt 226 lb (102.5 kg)   SpO2 98%   BMI 32.43 kg/m   Pulse Readings from Last 3 Encounters:  06/05/17 99  03/09/17 98  08/19/16 91     Physical Exam  Constitutional: He appears well-developed. He is active and cooperative.  Non-toxic appearance.  Cardiovascular: Normal rate, regular rhythm, S1 normal, S2 normal, normal heart sounds, intact distal pulses and normal pulses. Exam reveals no gallop and no friction rub.  No murmur heard. Pulmonary/Chest: Effort normal. No stridor. No tachypnea. No respiratory distress. He has no wheezes. He has no rales.  Abdominal: He exhibits no distension and no mass. There is tenderness (Mild, left lower quadrant.). There is no rebound and no guarding.  Musculoskeletal: He exhibits no edema.  Neurological: He is alert.  Skin: Skin is warm and dry. He is not diaphoretic. No pallor.  Vitals reviewed.   No results found for this or any previous visit (from the past 72 hour(s)).  No results found.  ASSESSMENT AND PLAN:  Jesse Steele was seen today for ibs.  Diagnoses and all orders for this visit:  Abdominal pain, left lower quadrant: History of acute diverticulitis on CT scan.  He has lower abdominal quadrant pain today.  Regardless of what his  labs say I cannot guarantee that he is not developing a new diverticulitis.  I am going to cover him for diverticulitis.  Labs out.  Patient with documented history of penicillin however this is nausea only, and given the risk factors associated with supra I would like to try to avoid this medication. -     amoxicillin-clavulanate (AUGMENTIN) 875-125 MG tablet; Take 1 tablet by  mouth 2 (two) times daily. Take with food. -     ondansetron (ZOFRAN) 4 MG tablet; Take for nausea associated with Augmentin if needed. -     CBC with Differential/Platelet -     Basic metabolic panel -     Hepatic function panel -     Lipase  History of diverticulitis  Patient here with abdominal pain  The patient is advised to call or return to clinic if he does not see an improvement in symptoms, or to seek the care of the closest emergency department if he worsens with the above plan.   Philis Fendt, MHS, PA-C [facility] 06/05/2017 10:23 AM

## 2017-06-06 LAB — CBC WITH DIFFERENTIAL/PLATELET
BASOS: 0 %
Basophils Absolute: 0 10*3/uL (ref 0.0–0.2)
EOS (ABSOLUTE): 0.2 10*3/uL (ref 0.0–0.4)
EOS: 2 %
HEMATOCRIT: 45.1 % (ref 37.5–51.0)
Hemoglobin: 15.1 g/dL (ref 13.0–17.7)
IMMATURE GRANULOCYTES: 1 %
Immature Grans (Abs): 0.1 10*3/uL (ref 0.0–0.1)
LYMPHS ABS: 1.7 10*3/uL (ref 0.7–3.1)
Lymphs: 20 %
MCH: 28.8 pg (ref 26.6–33.0)
MCHC: 33.5 g/dL (ref 31.5–35.7)
MCV: 86 fL (ref 79–97)
MONOS ABS: 0.6 10*3/uL (ref 0.1–0.9)
Monocytes: 7 %
NEUTROS ABS: 6.3 10*3/uL (ref 1.4–7.0)
Neutrophils: 70 %
Platelets: 224 10*3/uL (ref 150–379)
RBC: 5.25 x10E6/uL (ref 4.14–5.80)
RDW: 14 % (ref 12.3–15.4)
WBC: 8.8 10*3/uL (ref 3.4–10.8)

## 2017-06-06 LAB — BASIC METABOLIC PANEL
BUN / CREAT RATIO: 16 (ref 9–20)
BUN: 12 mg/dL (ref 6–24)
CO2: 20 mmol/L (ref 20–29)
Calcium: 9.1 mg/dL (ref 8.7–10.2)
Chloride: 102 mmol/L (ref 96–106)
Creatinine, Ser: 0.73 mg/dL — ABNORMAL LOW (ref 0.76–1.27)
GFR, EST AFRICAN AMERICAN: 130 mL/min/{1.73_m2} (ref >59)
GFR, EST NON AFRICAN AMERICAN: 112 mL/min/{1.73_m2} (ref >59)
Glucose: 106 mg/dL — ABNORMAL HIGH (ref 65–99)
Potassium: 4 mmol/L (ref 3.5–5.2)
Sodium: 141 mmol/L (ref 134–144)

## 2017-06-06 LAB — HEPATIC FUNCTION PANEL
ALT: 19 IU/L (ref 0–44)
AST: 11 IU/L (ref 0–40)
Albumin: 4.2 g/dL (ref 3.5–5.5)
Alkaline Phosphatase: 82 IU/L (ref 39–117)
BILIRUBIN TOTAL: 0.6 mg/dL (ref 0.0–1.2)
BILIRUBIN, DIRECT: 0.15 mg/dL (ref 0.00–0.40)
Total Protein: 6.2 g/dL (ref 6.0–8.5)

## 2017-06-06 LAB — LIPASE: LIPASE: 25 U/L (ref 13–78)

## 2017-06-22 DIAGNOSIS — N401 Enlarged prostate with lower urinary tract symptoms: Secondary | ICD-10-CM | POA: Diagnosis not present

## 2017-07-07 DIAGNOSIS — F900 Attention-deficit hyperactivity disorder, predominantly inattentive type: Secondary | ICD-10-CM | POA: Diagnosis not present

## 2017-08-13 DIAGNOSIS — N5201 Erectile dysfunction due to arterial insufficiency: Secondary | ICD-10-CM | POA: Diagnosis not present

## 2017-08-13 DIAGNOSIS — E291 Testicular hypofunction: Secondary | ICD-10-CM | POA: Diagnosis not present

## 2017-10-02 DIAGNOSIS — F900 Attention-deficit hyperactivity disorder, predominantly inattentive type: Secondary | ICD-10-CM | POA: Diagnosis not present

## 2017-10-13 ENCOUNTER — Ambulatory Visit: Payer: BLUE CROSS/BLUE SHIELD | Admitting: Emergency Medicine

## 2017-10-13 ENCOUNTER — Telehealth: Payer: Self-pay | Admitting: Family Medicine

## 2017-10-13 ENCOUNTER — Encounter: Payer: Self-pay | Admitting: Emergency Medicine

## 2017-10-13 VITALS — BP 124/70 | HR 74 | Temp 98.3°F | Resp 17 | Ht 70.0 in | Wt 228.0 lb

## 2017-10-13 DIAGNOSIS — R05 Cough: Secondary | ICD-10-CM | POA: Diagnosis not present

## 2017-10-13 DIAGNOSIS — J0100 Acute maxillary sinusitis, unspecified: Secondary | ICD-10-CM | POA: Insufficient documentation

## 2017-10-13 DIAGNOSIS — H6691 Otitis media, unspecified, right ear: Secondary | ICD-10-CM | POA: Diagnosis not present

## 2017-10-13 DIAGNOSIS — R0981 Nasal congestion: Secondary | ICD-10-CM | POA: Diagnosis not present

## 2017-10-13 DIAGNOSIS — J309 Allergic rhinitis, unspecified: Secondary | ICD-10-CM | POA: Insufficient documentation

## 2017-10-13 DIAGNOSIS — R059 Cough, unspecified: Secondary | ICD-10-CM | POA: Insufficient documentation

## 2017-10-13 MED ORDER — AZITHROMYCIN 200 MG/5ML PO SUSR: ORAL | 1 refills | Status: DC

## 2017-10-13 MED ORDER — PSEUDOEPHEDRINE-GUAIFENESIN ER 60-600 MG PO TB12: 1.0000 | ORAL_TABLET | Freq: Two times a day (BID) | ORAL | 1 refills | Status: AC

## 2017-10-13 MED ORDER — TRIAMCINOLONE ACETONIDE 55 MCG/ACT NA AERO: 2.0000 | INHALATION_SPRAY | Freq: Every day | NASAL | 12 refills | Status: DC

## 2017-10-13 NOTE — Progress Notes (Signed)
Jesse Steele 47 y.o.   Chief Complaint  Patient presents with  . Sinusitis    HISTORY OF PRESENT ILLNESS: This is a 47 y.o. male complaining of sinus symptoms for 1 week.  Complaining of coughing productive of clear sputum.  Had one episode of a nosebleed this morning.  Ears feel clogged.  Has tried saline sprays, Mucinex, and NyQuil.  Complaining of sinus congestion and pressure.  HPI   Prior to Admission medications   Medication Sig Start Date End Date Taking? Authorizing Provider  amphetamine-dextroamphetamine (ADDERALL XR) 20 MG 24 hr capsule Take 20 mg by mouth. 05/16/14  Yes [provider]  testosterone (ANDROGEL) 50 MG/5GM (1%) GEL Place 5 g onto the skin daily.   Yes [provider]    Allergies  Allergen Reactions  . Codeine Other (See Comments)    Reaction unknown (family allergy)  . Penicillins Nausea Only  . Sulfa Antibiotics Nausea Only    Patient Active Problem List   Diagnosis Date Noted  . GERD (gastroesophageal reflux disease) 05/27/2012  . Liver lesion 05/27/2012  . Stricture and stenosis of esophagus 11/03/2011  . Other dysphagia 11/03/2011  . ADHD (attention deficit hyperactivity disorder) 10/30/2011    Past Medical History:  Diagnosis Date  . ADHD (attention deficit hyperactivity disorder)   . Anxiety   . Diverticulosis   . Esophageal stricture   . GERD (gastroesophageal reflux disease)   . Liver lesion   . Low testosterone   . Lumbar degenerative disc disease     Past Surgical History:  Procedure Laterality Date  . ADENOIDECTOMY    . ESOPHAGOGASTRODUODENOSCOPY  11/03/2011   Procedure: ESOPHAGOGASTRODUODENOSCOPY (EGD);  Surgeon: Lafayette Dragon, MD;  Location: [facility];  Service: Endoscopy;  Laterality: N/A;  . SAVORY DILATION  11/03/2011   Procedure: SAVORY DILATION;  Surgeon: Lafayette Dragon, MD;  Location: [facility];  Service: Endoscopy;  Laterality: N/A;  . TONSILLECTOMY    . UMBILICAL HERNIA REPAIR    . UPPER  GASTROINTESTINAL ENDOSCOPY      Social History   Socioeconomic History  . Marital status: Married    Spouse name: Not on file  . Number of children: 1  . Years of education: Not on file  . Highest education level: Not on file  Occupational History  . Occupation: Press photographer  Social Needs  . Financial resource strain: Not on file  . Food insecurity:    Worry: Not on file    Inability: Not on file  . Transportation needs:    Medical: Not on file    Non-medical: Not on file  Tobacco Use  . Smoking status: Never Smoker  . Smokeless tobacco: Never Used  Substance and Sexual Activity  . Alcohol use: Yes    Alcohol/week: 1.0 oz    Types: 2 Standard drinks or equivalent per week    Comment: 3 Drinks per week  . Drug use: No  . Sexual activity: Yes    Partners: Female  Lifestyle  . Physical activity:    Days per week: Not on file    Minutes per session: Not on file  . Stress: Not on file  Relationships  . Social connections:    Talks on phone: Not on file    Gets together: Not on file    Attends religious service: Not on file    Active member of club or organization: Not on file    Attends meetings of clubs or organizations: Not on file  Relationship status: Not on file  . Intimate partner violence:    Fear of current or ex partner: Not on file    Emotionally abused: Not on file    Physically abused: Not on file    Forced sexual activity: Not on file  Other Topics Concern  . Not on file  Social History Narrative   Daily caffeine     Family History  Problem Relation Age of Onset  . Prostate cancer Father   . COPD Mother   . Heart disease Mother   . Heart disease Maternal Grandmother   . Kidney disease Paternal Grandmother   . Colon cancer Neg Hx      Review of Systems  Constitutional: Negative.  Negative for chills, fever and malaise/fatigue.  HENT: Positive for congestion, ear pain and nosebleeds. Negative for sore throat.   Eyes: Negative.  Negative for  blurred vision and double vision.  Respiratory: Positive for cough and sputum production. Negative for hemoptysis, shortness of breath and wheezing.   Cardiovascular: Negative.  Negative for chest pain, palpitations and leg swelling.  Gastrointestinal: Negative.  Negative for abdominal pain, diarrhea, nausea and vomiting.  Genitourinary: Negative.   Musculoskeletal: Negative for back pain, myalgias and neck pain.  Skin: Negative for rash.  Neurological: Negative for dizziness, sensory change, focal weakness and headaches.  Endo/Heme/Allergies: Negative.   All other systems reviewed and are negative.   Vitals:   10/13/17 0953  BP: 124/70  Pulse: 74  Resp: 17  Temp: 98.3 F (36.8 C)  SpO2: 98%    Physical Exam  Constitutional: He is oriented to person, place, and time. He appears well-developed and well-nourished.  HENT:  Head: Normocephalic and atraumatic.  Right Ear: External ear and ear canal normal. Tympanic membrane is injected.  Left Ear: Tympanic membrane, external ear and ear canal normal.  Nose: Nose normal.  Mouth/Throat: Oropharynx is clear and moist.  Eyes: Pupils are equal, round, and reactive to light. Conjunctivae and EOM are normal.  Neck: Normal range of motion. Neck supple. No JVD present.  Cardiovascular: Normal rate, regular rhythm and normal heart sounds.  Pulmonary/Chest: Effort normal and breath sounds normal.  Abdominal: Soft. Bowel sounds are normal. He exhibits no distension. There is no tenderness.  Musculoskeletal: Normal range of motion.  Lymphadenopathy:    He has no cervical adenopathy.  Neurological: He is alert and oriented to person, place, and time. No sensory deficit. He exhibits normal muscle tone.  Skin: Skin is warm and dry. Capillary refill takes less than 2 seconds. No rash noted.  Psychiatric: He has a normal mood and affect. His behavior is normal.  Vitals reviewed.    ASSESSMENT & PLAN: Dareld was seen today for  sinusitis.  Diagnoses and all orders for this visit:  Acute non-recurrent maxillary sinusitis -     azithromycin (ZITHROMAX) 200 MG/5ML suspension; Sig 500 mg on day 1 and then 250 mg daily for 4 days.  Sinus congestion -     triamcinolone (NASACORT) 55 MCG/ACT AERO nasal inhaler; Place 2 sprays into the nose daily. -     pseudoephedrine-guaifenesin (MUCINEX D) 60-600 MG 12 hr tablet; Take 1 tablet by mouth every 12 (twelve) hours for 5 days.  Cough  Right otitis media, unspecified otitis media type -     azithromycin (ZITHROMAX) 200 MG/5ML suspension; Sig 500 mg on day 1 and then 250 mg daily for 4 days.    Patient Instructions       IF you received  an x-ray today, you will receive an invoice from [facility]. Please contact [facility] at [phone] with questions or concerns regarding your invoice.   IF you received labwork today, you will receive an invoice from [facility]. Please contact [facility] at [phone] with questions or concerns regarding your invoice.   Our billing staff will not be able to assist you with questions regarding bills from these companies.  You will be contacted with the lab results as soon as they are available. The fastest way to get your results is to activate your My Chart account. Instructions are located on the last page of this paperwork. If you have not heard from [place] regarding the results in 2 weeks, please contact this office.      Sinusitis, Adult Sinusitis is soreness and inflammation of your sinuses. Sinuses are hollow spaces in the bones around your face. They are located:  Around your eyes.  In the middle of your forehead.  Behind your nose.  In your cheekbones.  Your sinuses and nasal passages are lined with a stringy fluid (mucus). Mucus normally drains out of your sinuses. When your nasal tissues get inflamed or swollen, the mucus can get trapped or blocked so air cannot flow through your sinuses. This lets  bacteria, viruses, and funguses grow, and that leads to infection. Follow these instructions at home: Medicines  Take, use, or apply over-the-counter and prescription medicines only as told by your doctor. These may include nasal sprays.  If you were prescribed an antibiotic medicine, take it as told by your doctor. Do not stop taking the antibiotic even if you start to feel better. Hydrate and Humidify  Drink enough water to keep your pee (urine) clear or pale yellow.  Use a cool mist humidifier to keep the humidity level in your home above 50%.  Breathe in steam for 10-15 minutes, 3-4 times a day or as told by your doctor. You can do this in the bathroom while a hot shower is running.  Try not to spend time in cool or dry air. Rest  Rest as much as possible.  Sleep with your head raised (elevated).  Make sure to get enough sleep each night. General instructions  Put a warm, moist washcloth on your face 3-4 times a day or as told by your doctor. This will help with discomfort.  Wash your hands often with soap and water. If there is no soap and water, use hand sanitizer.  Do not smoke. Avoid being around people who are smoking (secondhand smoke).  Keep all follow-up visits as told by your doctor. This is important. Contact a doctor if:  You have a fever.  Your symptoms get worse.  Your symptoms do not get better within 10 days. Get help right away if:  You have a very bad headache.  You cannot stop throwing up (vomiting).  You have pain or swelling around your face or eyes.  You have trouble seeing.  You feel confused.  Your neck is stiff.  You have trouble breathing. This information is not intended to replace advice given to you by your health care provider. Make sure you discuss any questions you have with your health care provider. Document Released: 10/15/2007 Document Revised: 12/23/2015 Document Reviewed: 02/21/2015 Elsevier Interactive Patient Education   2018 Elsevier Inc.      Agustina Caroli, MD [facility]

## 2017-10-13 NOTE — Patient Instructions (Addendum)
     IF you received an x-ray today, you will receive an invoice from [facility]. Please contact [facility] at [phone] with questions or concerns regarding your invoice.   IF you received labwork today, you will receive an invoice from [facility]. Please contact [facility] at [phone] with questions or concerns regarding your invoice.   Our billing staff will not be able to assist you with questions regarding bills from these companies.  You will be contacted with the lab results as soon as they are available. The fastest way to get your results is to activate your My Chart account. Instructions are located on the last page of this paperwork. If you have not heard from us regarding the results in 2 weeks, please contact this office.     Sinusitis, Adult Sinusitis is soreness and inflammation of your sinuses. Sinuses are hollow spaces in the bones around your face. They are located:  Around your eyes.  In the middle of your forehead.  Behind your nose.  In your cheekbones.  Your sinuses and nasal passages are lined with a stringy fluid (mucus). Mucus normally drains out of your sinuses. When your nasal tissues get inflamed or swollen, the mucus can get trapped or blocked so air cannot flow through your sinuses. This lets bacteria, viruses, and funguses grow, and that leads to infection. Follow these instructions at home: Medicines  Take, use, or apply over-the-counter and prescription medicines only as told by your doctor. These may include nasal sprays.  If you were prescribed an antibiotic medicine, take it as told by your doctor. Do not stop taking the antibiotic even if you start to feel better. Hydrate and Humidify  Drink enough water to keep your pee (urine) clear or pale yellow.  Use a cool mist humidifier to keep the humidity level in your home above 50%.  Breathe in steam for 10-15 minutes, 3-4 times a day or as told by your doctor. You can do  this in the bathroom while a hot shower is running.  Try not to spend time in cool or dry air. Rest  Rest as much as possible.  Sleep with your head raised (elevated).  Make sure to get enough sleep each night. General instructions  Put a warm, moist washcloth on your face 3-4 times a day or as told by your doctor. This will help with discomfort.  Wash your hands often with soap and water. If there is no soap and water, use hand sanitizer.  Do not smoke. Avoid being around people who are smoking (secondhand smoke).  Keep all follow-up visits as told by your doctor. This is important. Contact a doctor if:  You have a fever.  Your symptoms get worse.  Your symptoms do not get better within 10 days. Get help right away if:  You have a very bad headache.  You cannot stop throwing up (vomiting).  You have pain or swelling around your face or eyes.  You have trouble seeing.  You feel confused.  Your neck is stiff.  You have trouble breathing. This information is not intended to replace advice given to you by your health care provider. Make sure you discuss any questions you have with your health care provider. Document Released: 10/15/2007 Document Revised: 12/23/2015 Document Reviewed: 02/21/2015 Elsevier Interactive Patient Education  2018 Elsevier Inc.  

## 2017-10-13 NOTE — Telephone Encounter (Signed)
Copied from [place] [id number]. Topic: Quick Communication - Rx Refill/Question >> Oct 13, 2017 10:48 AM Oliver Pila B wrote: Pharmacist is unsure if the medication below is needing to be a solid or a liquid call pharmacy   Medication: azithromycin (ZITHROMAX) 200 MG/5ML suspension [[id number]]   Has the patient contacted their pharmacy? Yes.   (Agent: If no, request that the patient contact the pharmacy for the refill.) (Agent: If yes, when and what did the pharmacy advise?)  Preferred Pharmacy (with phone number [facility] street [facility] [place]  Agent: Please be advised that RX refills may take up to 3 business days. We ask that you follow-up with your pharmacy.

## 2017-10-14 NOTE — Telephone Encounter (Signed)
OV 10/13/17 OOI:[id number] Pharmacy: [facility] [place] [place] [phone] (Phone) [phone] (Fax)

## 2017-10-14 NOTE — Telephone Encounter (Signed)
No changes. Pharmacy filled for liquid.

## 2017-10-16 DIAGNOSIS — E291 Testicular hypofunction: Secondary | ICD-10-CM | POA: Diagnosis not present

## 2017-10-26 DIAGNOSIS — Z8042 Family history of malignant neoplasm of prostate: Secondary | ICD-10-CM | POA: Diagnosis not present

## 2017-10-26 DIAGNOSIS — N5201 Erectile dysfunction due to arterial insufficiency: Secondary | ICD-10-CM | POA: Diagnosis not present

## 2017-10-26 DIAGNOSIS — E291 Testicular hypofunction: Secondary | ICD-10-CM | POA: Diagnosis not present

## 2017-12-23 DIAGNOSIS — F900 Attention-deficit hyperactivity disorder, predominantly inattentive type: Secondary | ICD-10-CM | POA: Diagnosis not present

## 2018-01-08 ENCOUNTER — Encounter: Payer: Self-pay | Admitting: Family Medicine

## 2018-01-08 ENCOUNTER — Ambulatory Visit: Payer: BLUE CROSS/BLUE SHIELD | Admitting: Family Medicine

## 2018-01-08 ENCOUNTER — Ambulatory Visit (INDEPENDENT_AMBULATORY_CARE_PROVIDER_SITE_OTHER): Payer: BLUE CROSS/BLUE SHIELD

## 2018-01-08 VITALS — BP 122/78 | HR 83 | Temp 98.5°F | Ht 70.0 in | Wt 223.0 lb

## 2018-01-08 DIAGNOSIS — J22 Unspecified acute lower respiratory infection: Secondary | ICD-10-CM | POA: Diagnosis not present

## 2018-01-08 DIAGNOSIS — R509 Fever, unspecified: Secondary | ICD-10-CM

## 2018-01-08 DIAGNOSIS — R05 Cough: Secondary | ICD-10-CM

## 2018-01-08 DIAGNOSIS — R059 Cough, unspecified: Secondary | ICD-10-CM

## 2018-01-08 MED ORDER — GUAIFENESIN-CODEINE 100-10 MG/5ML PO SYRP: 5.0000 mL | ORAL_SOLUTION | Freq: Three times a day (TID) | ORAL | 0 refills | Status: DC | PRN

## 2018-01-08 MED ORDER — AZITHROMYCIN 200 MG/5ML PO SUSR: ORAL | 0 refills | Status: AC

## 2018-01-08 MED ORDER — BENZONATATE 100 MG PO CAPS: 100.0000 mg | ORAL_CAPSULE | Freq: Three times a day (TID) | ORAL | 0 refills | Status: DC | PRN

## 2018-01-08 NOTE — Progress Notes (Signed)
8/30/201910:57 AM  Jesse Steele [birth year], 47 y.o. male [id number]  Chief Complaint  Patient presents with  . Cough    productive cough and diarrhea, taking mucinex and nasacort. Had some fever yesterday. Coughing is causing stomach pain    HPI:   Patient is a 47 y.o. male who presents today for cough and fever  Coughing, productive x 4 days Nasal congestion, nasacort has helped + PND, sinus pressure Having pain right lower chest from coughing, not from breathing Mild diarrhea Nausea but no vomiting Had fever yesterday of 101.7, aleve yesterday, not today, no chills Better today No sob Wife sick last week with URI Son had walking pna 3 weeks ago He would like a chest xray as worried he might have pna  Fall Risk  10/13/2017 06/05/2017 03/09/2017 06/02/2016 05/26/2016  Falls in the past year? No No No No No     Depression screen [facility] 2/9 10/13/2017 06/05/2017 03/09/2017  Decreased Interest 0 0 0  Down, Depressed, Hopeless 0 0 0  PHQ - 2 Score 0 0 0    Allergies  Allergen Reactions  . Codeine Other (See Comments)    Reaction unknown (family allergy)  . Penicillins Nausea Only  . Sulfa Antibiotics Nausea Only    Prior to Admission medications   Medication Sig Start Date End Date Taking? Authorizing Provider  lisdexamfetamine (VYVANSE) 50 MG capsule Take by mouth. 12/23/17  Yes [provider]  amphetamine-dextroamphetamine (ADDERALL XR) 20 MG 24 hr capsule Take 20 mg by mouth. 05/16/14   [provider]  testosterone (ANDROGEL) 50 MG/5GM (1%) GEL Place 5 g onto the skin daily.    [provider]  triamcinolone (NASACORT) 55 MCG/ACT AERO nasal inhaler Place 2 sprays into the nose daily. 10/13/17   Horald Pollen, MD    Past Medical History:  Diagnosis Date  . ADHD (attention deficit hyperactivity disorder)   . Anxiety   . Diverticulosis   . Esophageal stricture   . GERD (gastroesophageal reflux disease)   . Liver lesion   . Low  testosterone   . Lumbar degenerative disc disease     Past Surgical History:  Procedure Laterality Date  . ADENOIDECTOMY    . ESOPHAGOGASTRODUODENOSCOPY  11/03/2011   Procedure: ESOPHAGOGASTRODUODENOSCOPY (EGD);  Surgeon: Lafayette Dragon, MD;  Location: [facility];  Service: Endoscopy;  Laterality: N/A;  . SAVORY DILATION  11/03/2011   Procedure: SAVORY DILATION;  Surgeon: Lafayette Dragon, MD;  Location: [facility];  Service: Endoscopy;  Laterality: N/A;  . TONSILLECTOMY    . UMBILICAL HERNIA REPAIR    . UPPER GASTROINTESTINAL ENDOSCOPY      Social History   Tobacco Use  . Smoking status: Never Smoker  . Smokeless tobacco: Never Used  Substance Use Topics  . Alcohol use: Yes    Alcohol/week: 2.0 standard drinks    Types: 2 Standard drinks or equivalent per week    Comment: 3 Drinks per week    Family History  Problem Relation Age of Onset  . Prostate cancer Father   . COPD Mother   . Heart disease Mother   . Heart disease Maternal Grandmother   . Kidney disease Paternal Grandmother   . Colon cancer Neg Hx     ROS Per hpi  OBJECTIVE:  Blood pressure 122/78, pulse 83, temperature 98.5 F (36.9 C), temperature source Oral, height 5\' 10"  (1.778 m), weight 223 lb (101.2 kg), SpO2 95 %. Body mass index is 32 kg/m.  Physical Exam  Constitutional: He is oriented to person, place, and time. He appears well-developed and well-nourished.  HENT:  Head: Normocephalic and atraumatic.  Right Ear: Hearing, tympanic membrane, external ear and ear canal normal.  Left Ear: Hearing, tympanic membrane, external ear and ear canal normal.  Mouth/Throat: Oropharynx is clear and moist. No oropharyngeal exudate.  Eyes: Pupils are equal, round, and reactive to light. Conjunctivae and EOM are normal.  Neck: Neck supple.  Cardiovascular: Normal rate and regular rhythm. Exam reveals no gallop and no friction rub.  No murmur heard. Pulmonary/Chest: Effort normal and breath sounds normal.  He has no wheezes. He has no rales.  Musculoskeletal: He exhibits no edema.  Lymphadenopathy:    He has no cervical adenopathy.  Neurological: He is alert and oriented to person, place, and time.  Skin: Skin is warm and dry.  Psychiatric: He has a normal mood and affect.  Nursing note and vitals reviewed.   No results found for this or any previous visit (from the past 24 hour(s)).  Dg Chest 2 View  Result Date: 01/08/2018 CLINICAL DATA:  Productive cough.  Fever. EXAM: CHEST - 2 VIEW COMPARISON:  09/05/2013. FINDINGS: Mediastinum hilar structures normal. Heart size stable. Low lung volumes. Mild bibasilar infiltrates. No pleural effusion or pneumothorax. No acute bony abnormality. IMPRESSION: Low lung volumes with mild bibasilar pulmonary infiltrates. Electronically Signed   By: [place]   On: 01/08/2018 11:35     ASSESSMENT and PLAN  1. Lower respiratory tract infectious disease Discussed most likely viral as patient AF today, normal resp and o2 sat.  Discussed supportive measures, discussed delayed abx given pna exposure  2. Cough Patient has had codeine cough med before wo any issues - DG Chest 2 View; Future  3. Fever, unspecified - DG Chest 2 View; Future  Other orders - benzonatate (TESSALON) 100 MG capsule; Take 1-2 capsules (100-200 mg total) by mouth 3 (three) times daily as needed for cough. - guaiFENesin-codeine (ROBITUSSIN AC) 100-10 MG/5ML syrup; Take 5 mLs by mouth 3 (three) times daily as needed for cough. - azithromycin (ZITHROMAX) 200 MG/5ML suspension; Take 12.5 mLs (500 mg total) by mouth daily for 1 day, THEN 6.3 mLs (250 mg total) daily for 4 days.  Return if symptoms worsen or fail to improve.    Rutherford Guys, MD [facility] [place], [place] [address] Ph.  [phone] Fax [phone]

## 2018-01-14 ENCOUNTER — Telehealth: Payer: Self-pay | Admitting: Family Medicine

## 2018-01-14 ENCOUNTER — Encounter: Payer: Self-pay | Admitting: Family Medicine

## 2018-01-14 NOTE — Telephone Encounter (Signed)
Copied from [place] [id number]. Topic: General - Other >> Jan 14, 2018  1:45 PM Keene Breath wrote: Reason for CRM: Patient called to speak with the nurse or doctor regarding his test results on his lung.  Patient read that he has low lung capacity and he is concerned and would like someone clinical to explain what that means.  Please advise.  CB# [phone]

## 2018-01-18 NOTE — Telephone Encounter (Signed)
Pt informed thru my-chart about xray.  Called to confirm he received message.

## 2018-01-19 ENCOUNTER — Encounter: Payer: Self-pay | Admitting: Physician Assistant

## 2018-01-19 ENCOUNTER — Other Ambulatory Visit: Payer: Self-pay

## 2018-01-19 ENCOUNTER — Ambulatory Visit: Payer: BLUE CROSS/BLUE SHIELD | Admitting: Physician Assistant

## 2018-01-19 VITALS — BP 130/80 | HR 68 | Temp 98.6°F | Resp 16 | Ht 69.0 in | Wt 222.8 lb

## 2018-01-19 DIAGNOSIS — M6289 Other specified disorders of muscle: Secondary | ICD-10-CM | POA: Diagnosis not present

## 2018-01-19 DIAGNOSIS — M5440 Lumbago with sciatica, unspecified side: Secondary | ICD-10-CM

## 2018-01-19 LAB — POCT URINALYSIS DIP (MANUAL ENTRY)
Bilirubin, UA: NEGATIVE
Blood, UA: NEGATIVE
Glucose, UA: NEGATIVE mg/dL
Ketones, POC UA: NEGATIVE mg/dL
Leukocytes, UA: NEGATIVE
Nitrite, UA: NEGATIVE
Protein Ur, POC: NEGATIVE mg/dL
Spec Grav, UA: 1.01 (ref 1.010–1.025)
Urobilinogen, UA: 0.2 E.U./dL
pH, UA: 5.5 (ref 5.0–8.0)

## 2018-01-19 MED ORDER — CYCLOBENZAPRINE HCL ER 15 MG PO CP24: 15.0000 mg | ORAL_CAPSULE | Freq: Every day | ORAL | 0 refills | Status: DC | PRN

## 2018-01-19 NOTE — Patient Instructions (Addendum)
Amrix is a muscle relaxer. This may make you drowsy. If tolerable, you can also take Ibuprofen (or Aleve) for pain.    Apply moist heat to the area. Wet a towel and wring it out so it is damp. Put it in the microwave for about 15-20 seconds - long enough to make it hot, but not too hot to apply to your skin causing burns. Do this for about 20-30 minutes, 3-4 times a day.   Perform gentle, light stretches 2-3 times a day.   Put a tennis ball between your back and a wall. Gentle massage the area with rolling the ball around the affected area. Try a foam roller.   Stay well hydrated - try to drink 32-64 oz/day.   If you feel like you need a new pillow, try "My Pillow".  Come back and see me if you would like to try a dry needling session. (see below).    Trigger Point Dry Needling   What is Trigger Point Dry Needling (DN)?   1. DN is a physical therapy technique used to treat muscle pain and Dysfunction.  Specifically, DN helps deactivate muscle trigger points (Muscle Knots).   2. A thin filiform needle is used to penetrate the skin and stimulate the underlying trigger point.  The goal is for a local twitch response (LTR) to occur and for the trigger point to relax.  No medication of any kind is injected during the procedure.   What Does Trigger Point Dry Needling Feel Like?   1. The procedures feels different for each individual patient.   Some patients report that they do not actually feel the needle enter the skin and overall the process is not painful.  Very  mild bleeding may occur.  However, many patients feel a deep cramping in the muscle in which the needle was inserted. This is the local twitch response.    How Will I Feel After The Treatment?   1. Soreness is normal, and the onset of soreness may not occur for a few hours.  Typically this soreness does not last longer than two days.   2. Bruising is uncommon, however; ice can be used to decrease any possible bruising.   3. In  rare cases feeling tired or nauseous after the treatment is normal.  In addition, your symptoms may get worse before they get better, this period will typically not last longer than 24 hours.   What Can I do After My Treatment?   1.  Increase your hydration by drinking more water for the next 24 hours.   2.  You may place ice or heat on the areas treated that have become sore, however don not use heat on inflamed or bruised areas.  Heat often brings more relief post needling.   3. You can continue your regular activities, but vigorous activity is not recommended initially after the treatment for 24 hours.   4. DN is best combined with other physical therapy such as strengthening, stretching, and other therapies.    IF you received an x-ray today, you will receive an invoice from [facility]. Please contact [facility] at [phone] with questions or concerns regarding your invoice.   IF you received labwork today, you will receive an invoice from [place]. Please contact [facility] at [phone] with questions or concerns regarding your invoice.   Our billing staff will not be able to assist you with questions regarding bills from these companies.  You will be contacted with the  lab results as soon as they are available. The fastest way to get your results is to activate your My Chart account. Instructions are located on the last page of this paperwork. If you have not heard from [place] regarding the results in 2 weeks, please contact this office.       IF you received an x-ray today, you will receive an invoice from [facility]. Please contact [facility] at [phone] with questions or concerns regarding your invoice.   IF you received labwork today, you will receive an invoice from [place]. Please contact [facility] at [phone] with questions or concerns regarding your invoice.   Our billing staff will not be able to assist you with questions  regarding bills from these companies.  You will be contacted with the lab results as soon as they are available. The fastest way to get your results is to activate your My Chart account. Instructions are located on the last page of this paperwork. If you have not heard from [place] regarding the results in 2 weeks, please contact this office.

## 2018-01-19 NOTE — Progress Notes (Signed)
Jesse Steele  MRN: [id number] DOB: 04-29-1971  PCP: Forrest Moron, MD  Subjective:  Pt is a 47 year old male who presents to clinic for left sided back pain x 3-4 days.  He was playing basketball and collided with opponent who hit him in his left mid back. Endorses reduced ROM and "tightness".  Denies hematuria, n/v, shob, cough.   He was here 1.5 weeks ago and treated for a lower respiratory tract infection - chest x-ray showed mild bibasilar infiltrates. Rx for tessalon, robitussin and azithromycin. He feels better.   Review of Systems  Constitutional: Negative for chills, diaphoresis, fatigue and fever.  HENT: Negative for congestion, postnasal drip, rhinorrhea, sinus pressure, sinus pain, sneezing and sore throat.   Respiratory: Positive for cough. Negative for shortness of breath and wheezing.   Musculoskeletal: Positive for back pain.  Skin: Negative.     Patient Active Problem List   Diagnosis Date Noted  . Acute non-recurrent maxillary sinusitis 10/13/2017  . Sinus congestion 10/13/2017  . Cough 10/13/2017  . Right otitis media 10/13/2017  . GERD (gastroesophageal reflux disease) 05/27/2012  . Liver lesion 05/27/2012  . Stricture and stenosis of esophagus 11/03/2011  . Other dysphagia 11/03/2011  . ADHD (attention deficit hyperactivity disorder) 10/30/2011    Current Outpatient Medications on File Prior to Visit  Medication Sig Dispense Refill  . lisdexamfetamine (VYVANSE) 50 MG capsule Take by mouth.    . testosterone (ANDROGEL) 50 MG/5GM (1%) GEL Place 5 g onto the skin daily.    Marland Kitchen triamcinolone (NASACORT) 55 MCG/ACT AERO nasal inhaler Place 2 sprays into the nose daily. 1 Inhaler 12  . amphetamine-dextroamphetamine (ADDERALL XR) 20 MG 24 hr capsule Take 20 mg by mouth.    Marland Kitchen guaiFENesin-codeine (ROBITUSSIN AC) 100-10 MG/5ML syrup Take 5 mLs by mouth 3 (three) times daily as needed for cough. (Patient not taking: Reported on 01/19/2018) 120 mL 0   No current  facility-administered medications on file prior to visit.     Allergies  Allergen Reactions  . Codeine Other (See Comments)    Reaction unknown (family allergy)  . Penicillins Nausea Only  . Sulfa Antibiotics Nausea Only     Objective:  BP 130/80 (BP Location: Left Arm, Cuff Size: Large)   Pulse 68   Temp 98.6 F (37 C) (Oral)   Resp 16   Ht 5\' 9"  (1.753 m)   Wt 222 lb 12.8 oz (101.1 kg)   SpO2 97%   BMI 32.90 kg/m   Physical Exam  Constitutional: He is oriented to person, place, and time. He appears well-developed and well-nourished.  Abdominal: There is no tenderness. There is no CVA tenderness.  Musculoskeletal:       Thoracic back: He exhibits tenderness. He exhibits normal range of motion and no bony tenderness.       Back:  Neurological: He is alert and oriented to person, place, and time.  Skin: Skin is warm and dry.  Psychiatric: His behavior is normal. Judgment and thought content normal. His mood appears anxious.  Vitals reviewed.   Results for orders placed or performed in visit on 01/19/18  POCT urinalysis dipstick  Result Value Ref Range   Color, UA yellow yellow   Clarity, UA clear clear   Glucose, UA negative negative mg/dL   Bilirubin, UA negative negative   Ketones, POC UA negative negative mg/dL   Spec Grav, UA 1.010 1.010 - 1.025   Blood, UA negative negative   pH, UA 5.5  5.0 - 8.0   Protein Ur, POC negative negative mg/dL   Urobilinogen, UA 0.2 0.2 or 1.0 E.U./dL   Nitrite, UA Negative Negative   Leukocytes, UA Negative Negative   Verbal consent obtained. Skin cleansed with alcohol. A trigger point/dry needling was performed at the site of maximal tenderness. This was well tolerated, and followed by 75% relief of pain.  Assessment and Plan :  1. Muscle tightness - Pt presents c/o back pain x 3 days s/p basketball collision. PE and HPI suggest MSK in origin. Negative UA. No red flags. Dry needling performed with 75% improvement of symptoms.  RTC if worsening symptoms.  - Inject trigger point, 1 or 2 - cyclobenzaprine (AMRIX) 15 MG 24 hr capsule; Take 1-2 capsules (15-30 mg total) by mouth daily as needed for muscle spasms.  Dispense: 10 capsule; Refill: 0  2. Acute low back pain with sciatica, sciatica laterality unspecified, unspecified back pain laterality - negative UA - POCT urinalysis dipstick  Mercer Pod, PA-C  [facility] 01/19/2018 4:35 PM  Please note: Portions of this report may have been transcribed using dragon voice recognition software. Every effort was made to ensure accuracy; however, inadvertent computerized transcription errors may be present.

## 2018-01-22 ENCOUNTER — Encounter: Payer: Self-pay | Admitting: Family Medicine

## 2018-01-22 ENCOUNTER — Ambulatory Visit: Payer: BLUE CROSS/BLUE SHIELD | Admitting: Family Medicine

## 2018-01-22 ENCOUNTER — Other Ambulatory Visit: Payer: Self-pay

## 2018-01-22 VITALS — BP 129/87 | HR 69 | Temp 98.7°F | Ht 69.0 in | Wt 224.2 lb

## 2018-01-22 DIAGNOSIS — Z8719 Personal history of other diseases of the digestive system: Secondary | ICD-10-CM | POA: Diagnosis not present

## 2018-01-22 DIAGNOSIS — R1032 Left lower quadrant pain: Secondary | ICD-10-CM | POA: Diagnosis not present

## 2018-01-22 DIAGNOSIS — M545 Low back pain, unspecified: Secondary | ICD-10-CM

## 2018-01-22 LAB — POCT URINALYSIS DIP (MANUAL ENTRY)
BILIRUBIN UA: NEGATIVE mg/dL
Bilirubin, UA: NEGATIVE
Blood, UA: NEGATIVE
GLUCOSE UA: NEGATIVE mg/dL
LEUKOCYTES UA: NEGATIVE
Nitrite, UA: NEGATIVE
PROTEIN UA: NEGATIVE mg/dL
SPEC GRAV UA: 1.015 (ref 1.010–1.025)
Urobilinogen, UA: 0.2 E.U./dL
pH, UA: 7 (ref 5.0–8.0)

## 2018-01-22 LAB — POC MICROSCOPIC URINALYSIS (UMFC): Mucus: ABSENT

## 2018-01-22 LAB — POCT CBC
GRANULOCYTE PERCENT: 69.8 % (ref 37–80)
HEMATOCRIT: 44.2 % (ref 43.5–53.7)
Hemoglobin: 15.1 g/dL (ref 14.1–18.1)
LYMPH, POC: 1.8 (ref 0.6–3.4)
MCH, POC: 29.1 pg (ref 27–31.2)
MCHC: 34.3 g/dL (ref 31.8–35.4)
MCV: 84.8 fL (ref 80–97)
MID (CBC): 0.4 (ref 0–0.9)
MPV: 7.1 fL (ref 0–99.8)
POC GRANULOCYTE: 5.3 (ref 2–6.9)
POC LYMPH PERCENT: 24.3 %L (ref 10–50)
POC MID %: 5.9 %M (ref 0–12)
Platelet Count, POC: 245 10*3/uL (ref 142–424)
RBC: 5.21 M/uL (ref 4.69–6.13)
RDW, POC: 13.8 %
WBC: 7.6 10*3/uL (ref 4.6–10.2)

## 2018-01-22 MED ORDER — CIPROFLOXACIN HCL 500 MG PO TABS: 500.0000 mg | ORAL_TABLET | Freq: Two times a day (BID) | ORAL | 0 refills | Status: DC

## 2018-01-22 MED ORDER — MELOXICAM 7.5 MG PO TABS: 7.5000 mg | ORAL_TABLET | Freq: Every day | ORAL | 0 refills | Status: DC

## 2018-01-22 MED ORDER — METRONIDAZOLE 500 MG PO TABS: 500.0000 mg | ORAL_TABLET | Freq: Two times a day (BID) | ORAL | 0 refills | Status: DC

## 2018-01-22 NOTE — Patient Instructions (Addendum)
Low back pain may still be a pulled muscle or contusion, and should improve with time. Ok to continue muscle relaxant at bedtime, can add mobic once per day for inflammation. Do not combine with advil or alleve (tylenol over the counter is ok).   Blood count and urine test are normal, but I am concerned that you may be possibly be experiencing an early flair of diverticulitis. If that pain is not resolving in next day with use of meds for back, then start cipro and flagyl.  IF you do need to start those meds, I do also recommend CT scan at that time to verify diverticulitis. Please follow up if those meds are needed.  Return to the clinic or go to the nearest emergency room if any of your symptoms worsen or new symptoms occur.   Return to the clinic or go to the nearest emergency room if any of your symptoms worsen or new symptoms occur.   Abdominal Pain, Adult Abdominal pain can be caused by many things. Often, abdominal pain is not serious and it gets better with no treatment or by being treated at home. However, sometimes abdominal pain is serious. Your health care provider will do a medical history and a physical exam to try to determine the cause of your abdominal pain. Follow these instructions at home:  Take over-the-counter and prescription medicines only as told by your health care provider. Do not take a laxative unless told by your health care provider.  Drink enough fluid to keep your urine clear or pale yellow.  Watch your condition for any changes.  Keep all follow-up visits as told by your health care provider. This is important. Contact a health care provider if:  Your abdominal pain changes or gets worse.  You are not hungry or you lose weight without trying.  You are constipated or have diarrhea for more than 2-3 days.  You have pain when you urinate or have a bowel movement.  Your abdominal pain wakes you up at night.  Your pain gets worse with meals, after eating,  or with certain foods.  You are throwing up and cannot keep anything down.  You have a fever. Get help right away if:  Your pain does not go away as soon as your health care provider told you to expect.  You cannot stop throwing up.  Your pain is only in areas of the abdomen, such as the right side or the left lower portion of the abdomen.  You have bloody or black stools, or stools that look like tar.  You have severe pain, cramping, or bloating in your abdomen.  You have signs of dehydration, such as: ? Dark urine, very little urine, or no urine. ? Cracked lips. ? Dry mouth. ? Sunken eyes. ? Sleepiness. ? Weakness. This information is not intended to replace advice given to you by your health care provider. Make sure you discuss any questions you have with your health care provider. Document Released: 02/05/2005 Document Revised: 11/16/2015 Document Reviewed: 10/10/2015 Elsevier Interactive Patient Education  2018 [employer].  Muscle Cramps and Spasms Muscle cramps and spasms are when muscles tighten by themselves. They usually get better within minutes. Muscle cramps are painful. They are usually stronger and last longer than muscle spasms. Muscle spasms may or may not be painful. They can last a few seconds or much longer. Follow these instructions at home:  Drink enough fluid to keep your pee (urine) clear or pale yellow.  Massage,  stretch, and relax the muscle.  If directed, apply heat to tight or tense muscles as often as told by your doctor. Use the heat source that your doctor recommends. ? Place a towel between your skin and the heat source. ? Leave the heat on for 20-30 minutes. ? Take off the heat if your skin turns bright red. This is especially important if you are unable to feel pain, heat, or cold. You may have a greater risk of getting burned.  If directed, put ice on the affected area. This may help if you are sore or have pain after a cramp or  spasm. ? Put ice in a plastic bag. ? Place a towel between your skin and the bag. ? Leave the ice on for 20 minutes, 2-3 times a day.  Take over-the-counter and prescription medicines only as told by your doctor.  Pay attention to any changes in your symptoms. Contact a doctor if:  Your cramps or spasms get worse or happen more often.  Your cramps or spasms do not get better with time. This information is not intended to replace advice given to you by your health care provider. Make sure you discuss any questions you have with your health care provider. Document Released: 04/10/2008 Document Revised: 05/30/2015 Document Reviewed: 01/30/2015 Elsevier Interactive Patient Education  2018 [employer].   Back Pain, Adult Many adults have back pain from time to time. Common causes of back pain include:  A strained muscle or ligament.  Wear and tear (degeneration) of the spinal disks.  Arthritis.  A hit to the back.  Back pain can be short-lived (acute) or last a long time (chronic). A physical exam, lab tests, and imaging studies may be done to find the cause of your pain. Follow these instructions at home: Managing pain and stiffness  Take over-the-counter and prescription medicines only as told by your health care provider.  If directed, apply heat to the affected area as often as told by your health care provider. Use the heat source that your health care provider recommends, such as a moist heat pack or a heating pad. ? Place a towel between your skin and the heat source. ? Leave the heat on for 20-30 minutes. ? Remove the heat if your skin turns bright red. This is especially important if you are unable to feel pain, heat, or cold. You have a greater risk of getting burned.  If directed, apply ice to the injured area: ? Put ice in a plastic bag. ? Place a towel between your skin and the bag. ? Leave the ice on for 20 minutes, 2-3 times a day for the first 2-3  days. Activity  Do not stay in bed. Resting more than 1-2 days can delay your recovery.  Take short walks on even surfaces as soon as you are able. Try to increase the length of time you walk each day.  Do not sit, drive, or stand in one place for more than 30 minutes at a time. Sitting or standing for long periods of time can put stress on your back.  Use proper lifting techniques. When you bend and lift, use positions that put less stress on your back: ? [place] your knees. ? Keep the load close to your body. ? Avoid twisting.  Exercise regularly as told by your health care provider. Exercising will help your back heal faster. This also helps prevent back injuries by keeping muscles strong and flexible.  Your health care provider  may recommend that you see a physical therapist. This person can help you come up with a safe exercise program. Do any exercises as told by your physical therapist. Lifestyle  Maintain a healthy weight. Extra weight puts stress on your back and makes it difficult to have good posture.  Avoid activities or situations that make you feel anxious or stressed. Learn ways to manage anxiety and stress. One way to manage stress is through exercise. Stress and anxiety increase muscle tension and can make back pain worse. General instructions  Sleep on a firm mattress in a comfortable position. Try lying on your side with your knees slightly bent. If you lie on your back, put a pillow under your knees.  Follow your treatment plan as told by your health care provider. This may include: ? Cognitive or behavioral therapy. ? Acupuncture or massage therapy. ? Meditation or yoga. Contact a health care provider if:  You have pain that is not relieved with rest or medicine.  You have increasing pain going down into your legs or buttocks.  Your pain does not improve in 2 weeks.  You have pain at night.  You lose weight.  You have a fever or chills. Get help right away  if:  You develop new bowel or bladder control problems.  You have unusual weakness or numbness in your arms or legs.  You develop nausea or vomiting.  You develop abdominal pain.  You feel faint. Summary  Many adults have back pain from time to time. A physical exam, lab tests, and imaging studies may be done to find the cause of your pain.  Use proper lifting techniques. When you bend and lift, use positions that put less stress on your back.  Take over-the-counter and prescription medicines and apply heat or ice as directed by your health care provider. This information is not intended to replace advice given to you by your health care provider. Make sure you discuss any questions you have with your health care provider. Document Released: 04/28/2005 Document Revised: 06/02/2016 Document Reviewed: 06/02/2016 Elsevier Interactive Patient Education  [name].   If you have lab work done today you will be contacted with your lab results within the next 2 weeks.  If you have not heard from [place] then please contact us. The fastest way to get your results is to register for My Chart.   IF you received an x-ray today, you will receive an invoice from [facility]. Please contact [facility] at [phone] with questions or concerns regarding your invoice.   IF you received labwork today, you will receive an invoice from [place]. Please contact [facility] at [phone] with questions or concerns regarding your invoice.   Our billing staff will not be able to assist you with questions regarding bills from these companies.  You will be contacted with the lab results as soon as they are available. The fastest way to get your results is to activate your My Chart account. Instructions are located on the last page of this paperwork. If you have not heard from [place] regarding the results in 2 weeks, please contact this office.

## 2018-01-22 NOTE — Progress Notes (Addendum)
Subjective:  By signing my name below, I, Jesse Steele, attest that this documentation has been prepared under the direction and in the presence of Merri Ray, MD. Electronically Signed: Moises Steele, [place]. 01/22/2018 , 1:00 PM .  Patient was seen in Room 2 .   Patient ID: Jesse Steele, male    DOB: 11-Apr-1971, 47 y.o.   MRN: [id number] Chief Complaint  Patient presents with  . Back Pain    reoccuring issue, thinks it may be a diverticulitis flare due to eating spicy chili   HPI Jesse Steele is a 47 y.o. male  Patient is here for follow up after some back pain that started a few days ago. He was seen by Mercer Pod on Sept 10th with left sided low back pain ongoing for about 3-4 days. At the time, he had complained colliding with another player while playing basketball. He had trigger point injection and dry needling done; treated with amrix taken at night. However, in the past few days, he's felt radiating pain over his left flank, and into his LLQ abdomen. He's still having bad spasms in his low back.   He reports initial date of collision about 7 days ago. Prior to that, he was seen for URI; but mentions fever and cough have resolved. He denies Steele in his urine. He took 2 tablets of Aleve this morning, with a little improvement. He states it feels worse today because the pain is radiating around. He's had dry heaving once in the past few days, but denies nausea or vomiting. He denies any Steele in stool or dark tarry stools.   He mentions history of diverticulitis in the past. He had some chili from [facility] today, but pain had been present prior to it. He had a CT done in Jan 2018 for acute diverticulitis. He states it doesn't feel like diverticulitis, as he describes that pain being a stabbing pain. Current abdominal pain is more throbbing. He notes taking Flagyl in the past to clear up the acute diverticulitis.   Last CT abdomen noted in January 2018 with acute diverticular  at that time without abscess.  Patient Active Problem List   Diagnosis Date Noted  . Acute non-recurrent maxillary sinusitis 10/13/2017  . Sinus congestion 10/13/2017  . Cough 10/13/2017  . Right otitis media 10/13/2017  . GERD (gastroesophageal reflux disease) 05/27/2012  . Liver lesion 05/27/2012  . Stricture and stenosis of esophagus 11/03/2011  . Other dysphagia 11/03/2011  . ADHD (attention deficit hyperactivity disorder) 10/30/2011   Past Medical History:  Diagnosis Date  . ADHD (attention deficit hyperactivity disorder)   . Anxiety   . Diverticulosis   . Esophageal stricture   . GERD (gastroesophageal reflux disease)   . Liver lesion   . Low testosterone   . Lumbar degenerative disc disease    Past Surgical History:  Procedure Laterality Date  . ADENOIDECTOMY    . ESOPHAGOGASTRODUODENOSCOPY  11/03/2011   Procedure: ESOPHAGOGASTRODUODENOSCOPY (EGD);  Surgeon: Lafayette Dragon, MD;  Location: [facility];  Service: Endoscopy;  Laterality: N/A;  . SAVORY DILATION  11/03/2011   Procedure: SAVORY DILATION;  Surgeon: Lafayette Dragon, MD;  Location: [facility];  Service: Endoscopy;  Laterality: N/A;  . TONSILLECTOMY    . UMBILICAL HERNIA REPAIR    . UPPER GASTROINTESTINAL ENDOSCOPY     Allergies  Allergen Reactions  . Codeine Other (See Comments)    Reaction unknown (family allergy)  . Penicillins Nausea Only  . Sulfa Antibiotics  Nausea Only   Prior to Admission medications   Medication Sig Start Date End Date Taking? Authorizing Provider  amphetamine-dextroamphetamine (ADDERALL XR) 20 MG 24 hr capsule Take 20 mg by mouth. 05/16/14   [provider]  cyclobenzaprine (AMRIX) 15 MG 24 hr capsule Take 1-2 capsules (15-30 mg total) by mouth daily as needed for muscle spasms. 01/19/18   McVey, Gelene Mink, PA-C  guaiFENesin-codeine (ROBITUSSIN AC) 100-10 MG/5ML syrup Take 5 mLs by mouth 3 (three) times daily as needed for cough. Patient not taking: Reported on  01/19/2018 01/08/18   Rutherford Guys, MD  lisdexamfetamine (VYVANSE) 50 MG capsule Take by mouth. 12/23/17   [provider]  testosterone (ANDROGEL) 50 MG/5GM (1%) GEL Place 5 g onto the skin daily.    [provider]  triamcinolone (NASACORT) 55 MCG/ACT AERO nasal inhaler Place 2 sprays into the nose daily. 10/13/17   Horald Pollen, MD   Social History   Socioeconomic History  . Marital status: Married    Spouse name: Not on file  . Number of children: 1  . Years of education: Not on file  . Highest education level: Not on file  Occupational History  . Occupation: Press photographer  Social Needs  . Financial resource strain: Not on file  . Food insecurity:    Worry: Not on file    Inability: Not on file  . Transportation needs:    Medical: Not on file    Non-medical: Not on file  Tobacco Use  . Smoking status: Never Smoker  . Smokeless tobacco: Never Used  Substance and Sexual Activity  . Alcohol use: Yes    Alcohol/week: 2.0 standard drinks    Types: 2 Standard drinks or equivalent per week    Comment: 3 Drinks per week  . Drug use: No  . Sexual activity: Yes    Partners: Female  Lifestyle  . Physical activity:    Days per week: Not on file    Minutes per session: Not on file  . Stress: Not on file  Relationships  . Social connections:    Talks on phone: Not on file    Gets together: Not on file    Attends religious service: Not on file    Active member of club or organization: Not on file    Attends meetings of clubs or organizations: Not on file    Relationship status: Not on file  . Intimate partner violence:    Fear of current or ex partner: Not on file    Emotionally abused: Not on file    Physically abused: Not on file    Forced sexual activity: Not on file  Other Topics Concern  . Not on file  Social History Narrative   Daily caffeine    Review of Systems  Constitutional: Negative for fatigue and unexpected weight change.  Eyes: Negative  for visual disturbance.  Respiratory: Negative for cough, chest tightness and shortness of breath.   Cardiovascular: Negative for chest pain, palpitations and leg swelling.  Gastrointestinal: Positive for abdominal pain. Negative for Steele in stool.  Genitourinary: Positive for flank pain. Negative for hematuria.  Musculoskeletal: Positive for back pain.  Neurological: Negative for dizziness, light-headedness and headaches.       Objective:   Physical Exam  Constitutional: He is oriented to person, place, and time. He appears well-developed and well-nourished. No distress.  HENT:  Head: Normocephalic and atraumatic.  Eyes: Pupils are equal, round, and reactive to light. EOM are  normal.  Neck: Neck supple.  Cardiovascular: Normal rate.  Pulmonary/Chest: Effort normal. No respiratory distress.  Abdominal: Bowel sounds are normal. There is tenderness in the left upper quadrant and left lower quadrant. There is no rebound and no guarding.  Slight tenderness over LLQ, greater than LUQ  Musculoskeletal: Normal range of motion.  Back: locates pain in paraspinals of his back, no localized tenderness of lumbar paraspinals, some tenderness over left flank, negative seated straight leg raise, reproduction of pain with left lateral flexion but equal rotation; skin intact, no bruising  Neurological: He is alert and oriented to person, place, and time.  Skin: Skin is warm and dry.  Psychiatric: He has a normal mood and affect. His behavior is normal.  Nursing note and vitals reviewed.   Vitals:   01/22/18 1229  BP: 129/87  Pulse: 69  Temp: 98.7 F (37.1 C)  TempSrc: Oral  SpO2: 97%  Weight: 224 lb 3.2 oz (101.7 kg)  Height: 5\' 9"  (1.753 m)   Results for orders placed or performed in visit on 01/22/18  POCT CBC  Result Value Ref Range   WBC 7.6 4.6 - 10.2 K/uL   Lymph, poc 1.8 0.6 - 3.4   POC LYMPH PERCENT 24.3 10 - 50 %L   MID (cbc) 0.4 0 - 0.9   POC MID % 5.9 0 - 12 %M   POC  Granulocyte 5.3 2 - 6.9   Granulocyte percent 69.8 37 - 80 %G   RBC 5.21 4.69 - 6.13 M/uL   Hemoglobin 15.1 14.1 - 18.1 g/dL   HCT, POC 44.2 43.5 - 53.7 %   MCV 84.8 80 - 97 fL   MCH, POC 29.1 27 - 31.2 pg   MCHC 34.3 31.8 - 35.4 g/dL   RDW, POC 13.8 %   Platelet Count, POC 245 142 - 424 K/uL   MPV 7.1 0 - 99.8 fL  POCT urinalysis dipstick  Result Value Ref Range   Color, UA yellow yellow   Clarity, UA clear clear   Glucose, UA negative negative mg/dL   Bilirubin, UA negative negative   Ketones, POC UA negative negative mg/dL   Spec Grav, UA 1.015 1.010 - 1.025   Steele, UA negative negative   pH, UA 7.0 5.0 - 8.0   Protein Ur, POC negative negative mg/dL   Urobilinogen, UA 0.2 0.2 or 1.0 E.U./dL   Nitrite, UA Negative Negative   Leukocytes, UA Negative Negative  POCT Microscopic Urinalysis ([facility])  Result Value Ref Range   WBC,UR,HPF,POC None None WBC/hpf   RBC,UR,HPF,POC None None RBC/hpf   Bacteria None None, Too numerous to count   Mucus Absent Absent   Epithelial Cells, UR Per Microscopy None None, Too numerous to count cells/hpf       Assessment & Plan:    Jesse Steele is a 47 y.o. male Acute left-sided low back pain without sciatica - Plan: meloxicam (MOBIC) 7.5 MG tablet  -Still suspect a contusion, versus lumbar strain, may take more time.  We will add meloxicam as anti-inflammatory, continue muscle relaxant as needed, RTC/ER precautions if worsening or not improving  Abdominal pain, left lower quadrant - Plan: POCT CBC, POCT urinalysis dipstick, POCT Microscopic Urinalysis ([facility]), ciprofloxacin (CIPRO) 500 MG tablet, metroNIDAZOLE (FLAGYL) 500 MG tablet History of diverticulitis - Plan: ciprofloxacin (CIPRO) 500 MG tablet, metroNIDAZOLE (FLAGYL) 500 MG tablet  -Reassuring CBC, but possible early diverticulitis.  May have some radiating pain from her back.  -Meloxicam as above for back  symptoms, if pain does not resolve within 24 to 48 hours, start Cipro and  Flagyl but discussed need for imaging if he does start antibiotics.  RTC/ER precautions given  Meds ordered this encounter  Medications  . meloxicam (MOBIC) 7.5 MG tablet    Sig: Take 1 tablet (7.5 mg total) by mouth daily.    Dispense:  30 tablet    Refill:  0  . ciprofloxacin (CIPRO) 500 MG tablet    Sig: Take 1 tablet (500 mg total) by mouth 2 (two) times daily.    Dispense:  28 tablet    Refill:  0  . metroNIDAZOLE (FLAGYL) 500 MG tablet    Sig: Take 1 tablet (500 mg total) by mouth 2 (two) times daily.    Dispense:  28 tablet    Refill:  0   Patient Instructions   Low back pain may still be a pulled muscle or contusion, and should improve with time. Ok to continue muscle relaxant at bedtime, can add mobic once per day for inflammation. Do not combine with advil or alleve (tylenol over the counter is ok).   Steele count and urine test are normal, but I am concerned that you may be possibly be experiencing an early flair of diverticulitis. If that pain is not resolving in next day with use of meds for back, then start cipro and flagyl.  IF you do need to start those meds, I do also recommend CT scan at that time to verify diverticulitis. Please follow up if those meds are needed.  Return to the clinic or go to the nearest emergency room if any of your symptoms worsen or new symptoms occur.   Return to the clinic or go to the nearest emergency room if any of your symptoms worsen or new symptoms occur.   Abdominal Pain, Adult Abdominal pain can be caused by many things. Often, abdominal pain is not serious and it gets better with no treatment or by being treated at home. However, sometimes abdominal pain is serious. Your health care provider will do a medical history and a physical exam to try to determine the cause of your abdominal pain. Follow these instructions at home:  Take over-the-counter and prescription medicines only as told by your health care provider. Do not take a  laxative unless told by your health care provider.  Drink enough fluid to keep your urine clear or pale yellow.  Watch your condition for any changes.  Keep all follow-up visits as told by your health care provider. This is important. Contact a health care provider if:  Your abdominal pain changes or gets worse.  You are not hungry or you lose weight without trying.  You are constipated or have diarrhea for more than 2-3 days.  You have pain when you urinate or have a bowel movement.  Your abdominal pain wakes you up at night.  Your pain gets worse with meals, after eating, or with certain foods.  You are throwing up and cannot keep anything down.  You have a fever. Get help right away if:  Your pain does not go away as soon as your health care provider told you to expect.  You cannot stop throwing up.  Your pain is only in areas of the abdomen, such as the right side or the left lower portion of the abdomen.  You have bloody or black stools, or stools that look like tar.  You have severe pain, cramping, or bloating in your abdomen.  You have signs of dehydration, such as: ? Dark urine, very little urine, or no urine. ? Cracked lips. ? Dry mouth. ? Sunken eyes. ? Sleepiness. ? Weakness. This information is not intended to replace advice given to you by your health care provider. Make sure you discuss any questions you have with your health care provider. Document Released: 02/05/2005 Document Revised: 11/16/2015 Document Reviewed: 10/10/2015 Elsevier Interactive Patient Education  2018 [place].  Muscle Cramps and Spasms Muscle cramps and spasms are when muscles tighten by themselves. They usually get better within minutes. Muscle cramps are painful. They are usually stronger and last longer than muscle spasms. Muscle spasms may or may not be painful. They can last a few seconds or much longer. Follow these instructions at home:  Drink enough fluid to keep your  pee (urine) clear or pale yellow.  Massage, stretch, and relax the muscle.  If directed, apply heat to tight or tense muscles as often as told by your doctor. Use the heat source that your doctor recommends. ? Place a towel between your skin and the heat source. ? Leave the heat on for 20-30 minutes. ? Take off the heat if your skin turns bright red. This is especially important if you are unable to feel pain, heat, or cold. You may have a greater risk of getting burned.  If directed, put ice on the affected area. This may help if you are sore or have pain after a cramp or spasm. ? Put ice in a plastic bag. ? Place a towel between your skin and the bag. ? Leave the ice on for 20 minutes, 2-3 times a day.  Take over-the-counter and prescription medicines only as told by your doctor.  Pay attention to any changes in your symptoms. Contact a doctor if:  Your cramps or spasms get worse or happen more often.  Your cramps or spasms do not get better with time. This information is not intended to replace advice given to you by your health care provider. Make sure you discuss any questions you have with your health care provider. Document Released: 04/10/2008 Document Revised: 05/30/2015 Document Reviewed: 01/30/2015 Elsevier Interactive Patient Education  2018 [employer].   Back Pain, Adult Many adults have back pain from time to time. Common causes of back pain include:  A strained muscle or ligament.  Wear and tear (degeneration) of the spinal disks.  Arthritis.  A hit to the back.  Back pain can be short-lived (acute) or last a long time (chronic). A physical exam, lab tests, and imaging studies may be done to find the cause of your pain. Follow these instructions at home: Managing pain and stiffness  Take over-the-counter and prescription medicines only as told by your health care provider.  If directed, apply heat to the affected area as often as told by your health care  provider. Use the heat source that your health care provider recommends, such as a moist heat pack or a heating pad. ? Place a towel between your skin and the heat source. ? Leave the heat on for 20-30 minutes. ? Remove the heat if your skin turns bright red. This is especially important if you are unable to feel pain, heat, or cold. You have a greater risk of getting burned.  If directed, apply ice to the injured area: ? Put ice in a plastic bag. ? Place a towel between your skin and the bag. ? Leave the ice on for 20 minutes, 2-3 times  a day for the first 2-3 days. Activity  Do not stay in bed. Resting more than 1-2 days can delay your recovery.  Take short walks on even surfaces as soon as you are able. Try to increase the length of time you walk each day.  Do not sit, drive, or stand in one place for more than 30 minutes at a time. Sitting or standing for long periods of time can put stress on your back.  Use proper lifting techniques. When you bend and lift, use positions that put less stress on your back: ? [place] your knees. ? Keep the load close to your body. ? Avoid twisting.  Exercise regularly as told by your health care provider. Exercising will help your back heal faster. This also helps prevent back injuries by keeping muscles strong and flexible.  Your health care provider may recommend that you see a physical therapist. This person can help you come up with a safe exercise program. Do any exercises as told by your physical therapist. Lifestyle  Maintain a healthy weight. Extra weight puts stress on your back and makes it difficult to have good posture.  Avoid activities or situations that make you feel anxious or stressed. Learn ways to manage anxiety and stress. One way to manage stress is through exercise. Stress and anxiety increase muscle tension and can make back pain worse. General instructions  Sleep on a firm mattress in a comfortable position. Try lying on your  side with your knees slightly bent. If you lie on your back, put a pillow under your knees.  Follow your treatment plan as told by your health care provider. This may include: ? Cognitive or behavioral therapy. ? Acupuncture or massage therapy. ? Meditation or yoga. Contact a health care provider if:  You have pain that is not relieved with rest or medicine.  You have increasing pain going down into your legs or buttocks.  Your pain does not improve in 2 weeks.  You have pain at night.  You lose weight.  You have a fever or chills. Get help right away if:  You develop new bowel or bladder control problems.  You have unusual weakness or numbness in your arms or legs.  You develop nausea or vomiting.  You develop abdominal pain.  You feel faint. Summary  Many adults have back pain from time to time. A physical exam, lab tests, and imaging studies may be done to find the cause of your pain.  Use proper lifting techniques. When you bend and lift, use positions that put less stress on your back.  Take over-the-counter and prescription medicines and apply heat or ice as directed by your health care provider. This information is not intended to replace advice given to you by your health care provider. Make sure you discuss any questions you have with your health care provider. Document Released: 04/28/2005 Document Revised: 06/02/2016 Document Reviewed: 06/02/2016 Elsevier Interactive Patient Education  [name].   If you have lab work done today you will be contacted with your lab results within the next 2 weeks.  If you have not heard from [place] then please contact us. The fastest way to get your results is to register for My Chart.   IF you received an x-ray today, you will receive an invoice from [facility]. Please contact [facility] at [phone] with questions or concerns regarding your invoice.   IF you received labwork today, you will  receive an invoice from [employer] [facility].  Please contact [facility] at [phone] with questions or concerns regarding your invoice.   Our billing staff will not be able to assist you with questions regarding bills from these companies.  You will be contacted with the lab results as soon as they are available. The fastest way to get your results is to activate your My Chart account. Instructions are located on the last page of this paperwork. If you have not heard from [place] regarding the results in 2 weeks, please contact this office.      I personally performed the services described in this documentation, which was scribed in my presence. The recorded information has been reviewed and considered for accuracy and completeness, addended by me as needed, and agree with information above.  Signed,   Merri Ray, MD [facility].  01/26/18 3:58 PM

## 2018-02-14 ENCOUNTER — Other Ambulatory Visit: Payer: Self-pay | Admitting: Family Medicine

## 2018-02-14 DIAGNOSIS — M545 Low back pain, unspecified: Secondary | ICD-10-CM

## 2018-02-15 NOTE — Telephone Encounter (Signed)
Patient called, left detailed VM to call the office to schedule an appointment with Dr. Carlota Raspberry to follow up on the reason for Meloxicam. Ordered last OV 01/22/18 for back pain.

## 2018-03-12 DIAGNOSIS — F900 Attention-deficit hyperactivity disorder, predominantly inattentive type: Secondary | ICD-10-CM | POA: Diagnosis not present

## 2018-04-19 DIAGNOSIS — R350 Frequency of micturition: Secondary | ICD-10-CM | POA: Diagnosis not present

## 2018-04-19 DIAGNOSIS — N401 Enlarged prostate with lower urinary tract symptoms: Secondary | ICD-10-CM | POA: Diagnosis not present

## 2018-04-19 DIAGNOSIS — E291 Testicular hypofunction: Secondary | ICD-10-CM | POA: Diagnosis not present

## 2018-04-26 DIAGNOSIS — E291 Testicular hypofunction: Secondary | ICD-10-CM | POA: Diagnosis not present

## 2018-04-26 DIAGNOSIS — N5201 Erectile dysfunction due to arterial insufficiency: Secondary | ICD-10-CM | POA: Diagnosis not present

## 2018-05-22 IMAGING — CT CT ABD-PELV W/ CM
2 of 5 series · 15 of 46 positions shown, 17 images · IV contrast (ISOVUE)
Comparison: 05/10/2012 CT

CLINICAL DATA: Left lower quadrant pain x2 days. History of
diverticulosis.

EXAM:
CT ABDOMEN AND PELVIS WITH CONTRAST
TECHNIQUE: Multidetector CT imaging of the abdomen and pelvis was performed
using the standard protocol following bolus administration of
intravenous contrast.
CONTRAST:  100mL 64ZEKH-SEE IOPAMIDOL (64ZEKH-SEE) INJECTION 61%

[Series 2: abd/pel with · axial · 0.88mm/px · z∈[-621,-216]mm · 12 of 95 slices shown, 14 images]
[im 7/95  soft-tissue]
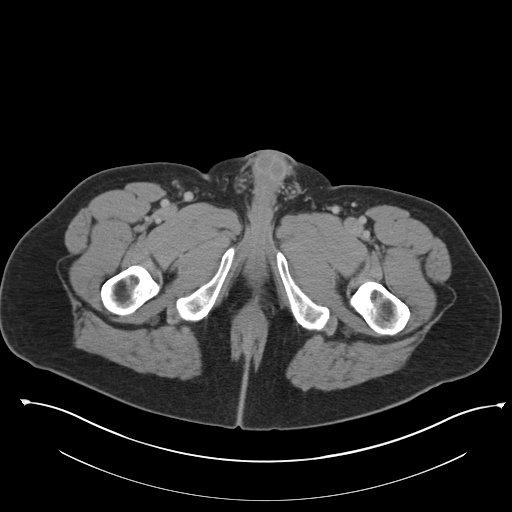
[im 7/95  bone]
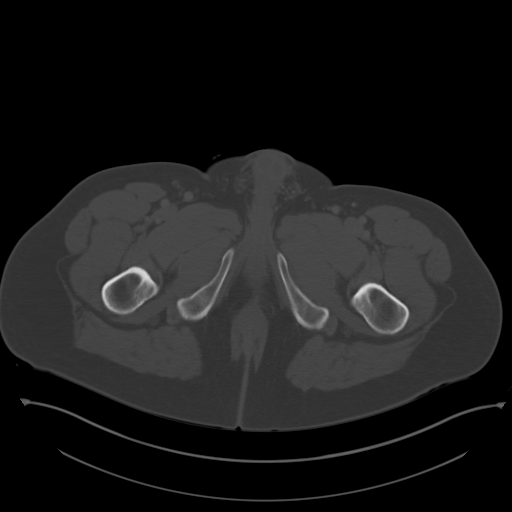
[im 14/95  soft-tissue]
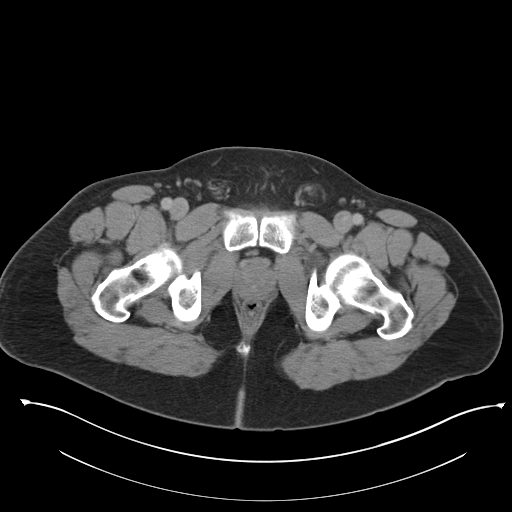
[im 21/95  soft-tissue]
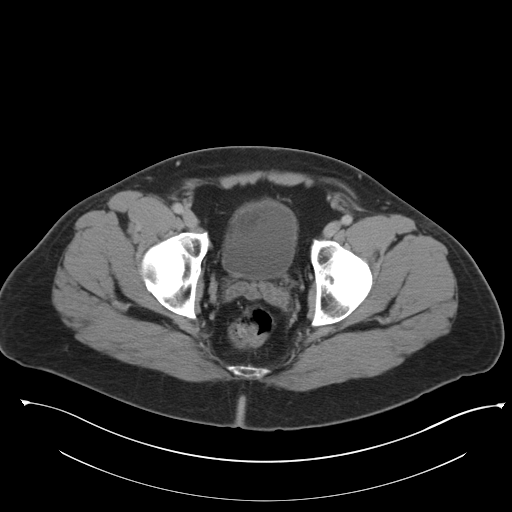
[im 27/95  soft-tissue]
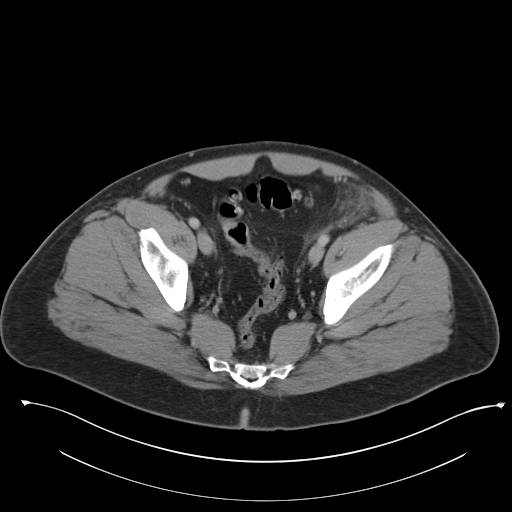
[im 34/95  soft-tissue]
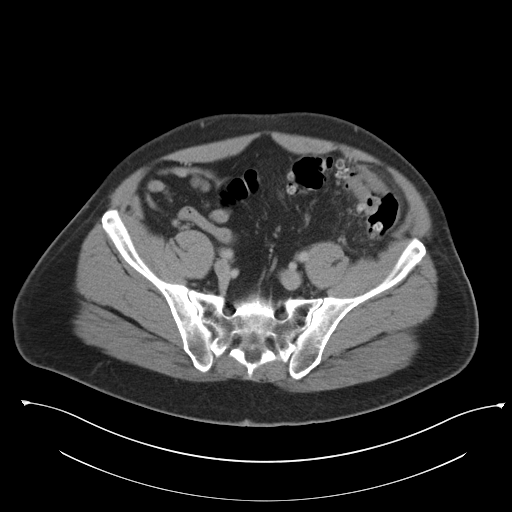
[im 41/95  soft-tissue]
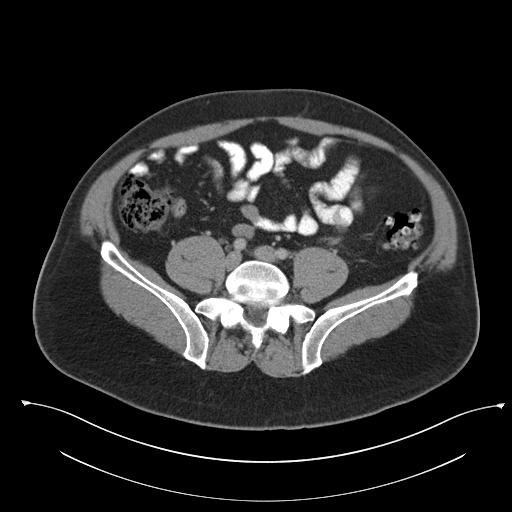
[im 54/95  soft-tissue]
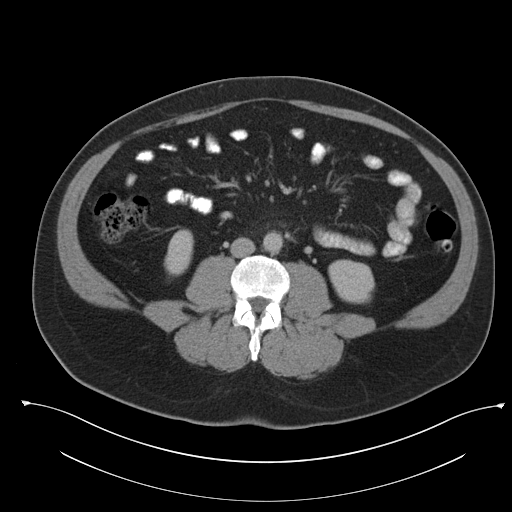
[im 61/95  soft-tissue]
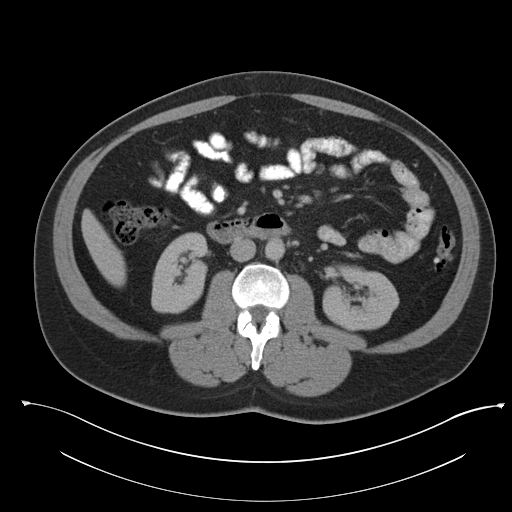
[im 68/95  soft-tissue]
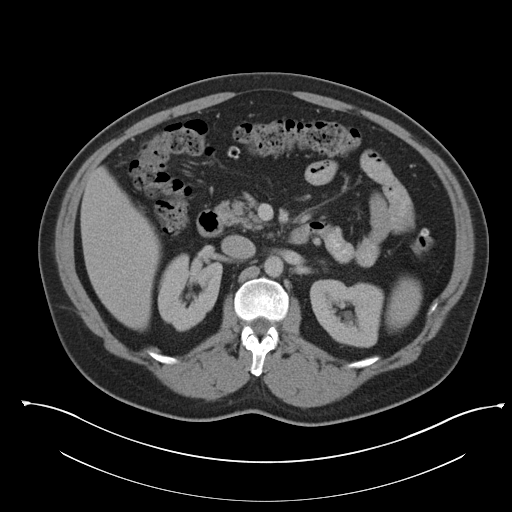
[im 68/95  bone]
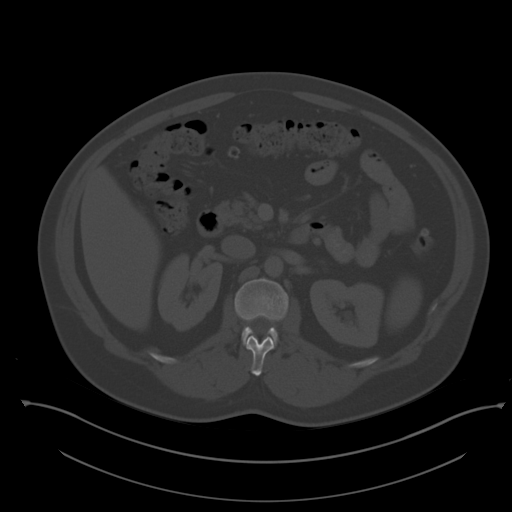
[im 74/95  soft-tissue]
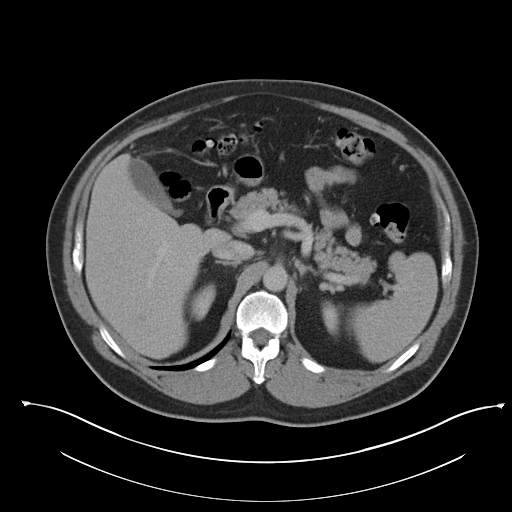
[im 81/95  soft-tissue]
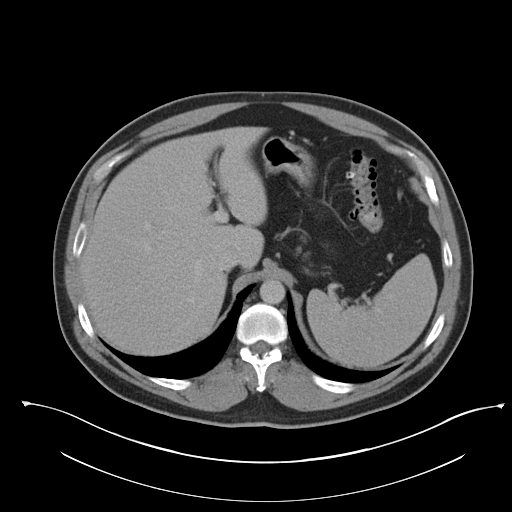
[im 88/95  soft-tissue]
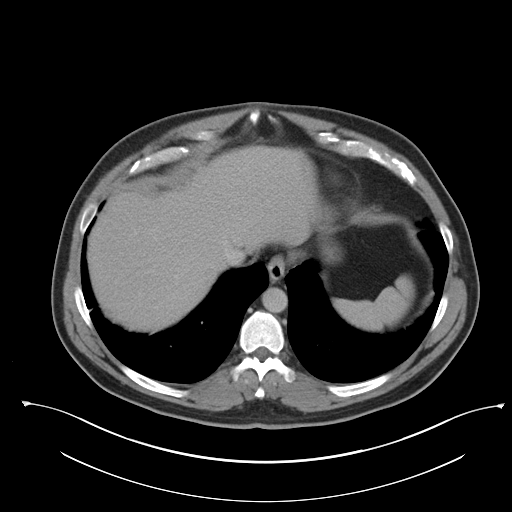

[Series 4: coronal a/|p · coronal · 0.74mm/px · 3 of 191 slices shown]
[im 64/191  soft-tissue]
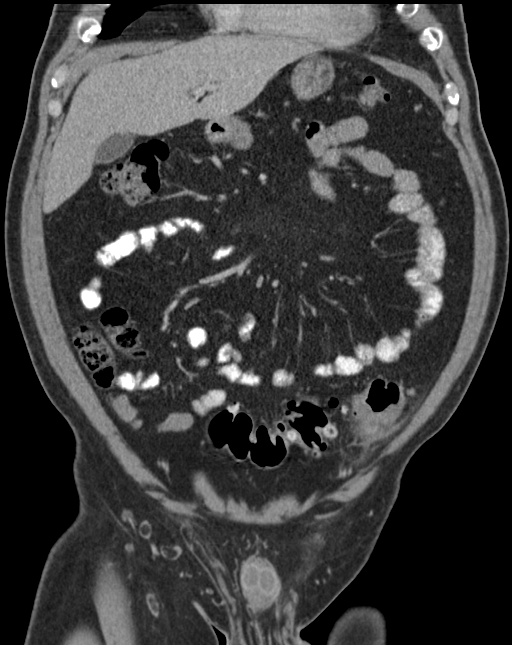
[im 85/191  soft-tissue]
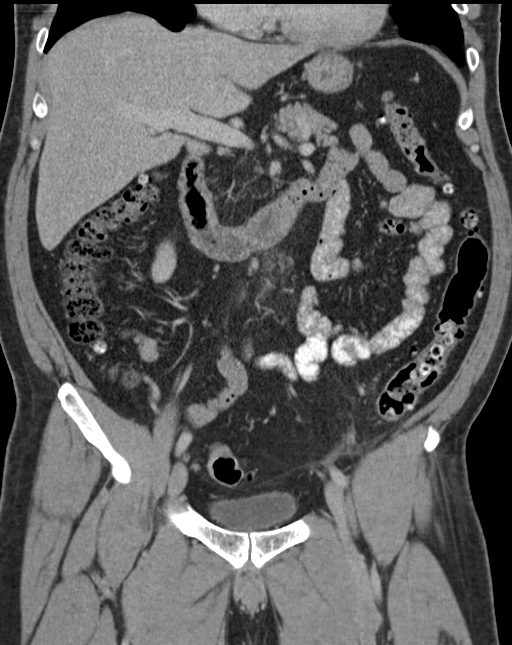
[im 106/191  soft-tissue]
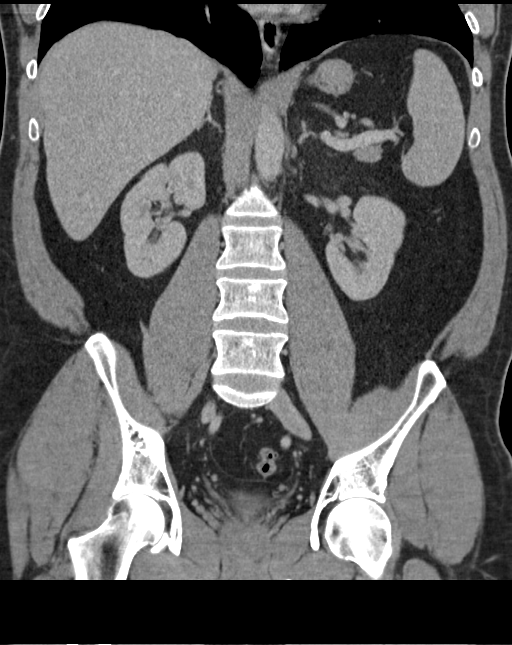

[15 of 46 positions shown; findings below may reference images not displayed]

FINDINGS: Lower chest: Streaky parenchymal opacities in the lower lobes
consistent with atelectasis and/or scarring. No pulmonic
consolidation, effusion or pneumothorax. Visualized cardiac chambers
are within normal limits for size. No pericardial effusion.

Hepatobiliary: No biliary dilatation. Unremarkable gallbladder. Tiny
6-7mm hypodensities in the right hepatic lobe series 2, image 11 and
21 are identified, too small to further characterize. However the
more cephalad hypodensity appears stable since 1424 and is
consistent with a benign finding. The more caudad was not apparent
on previous CT study, but noted on ultrasound as an echogenic lesion
and may also represent a tiny hemangioma based on prior ultrasound
findings. No suspicious areas of enhancement are identified.

Pancreas: Normal

Spleen: Normal

Adrenals/Urinary Tract: Adrenal glands are unremarkable. Kidneys are
normal, without renal calculi, focal lesion, or hydronephrosis.
Bladder is unremarkable.

Stomach/Bowel: Pericolonic inflammation at the junction of the
distal descending and proximal sigmoid colon consistent with acute
diverticulitis superimposed on a background of moderate
diverticulosis involving the descending and proximal sigmoid colon.
No abscess or bowel obstruction. No free air.

Vascular/Lymphatic: No significant vascular findings are present. No
enlarged abdominal or pelvic lymph nodes.

Reproductive: Prostate is unremarkable.

Other: No abdominal wall hernia or abnormality. No abdominopelvic
ascites.

Musculoskeletal: No acute or significant osseous findings.
IMPRESSION: Acute diverticulitis at the junction of the descending and proximal
sigmoid colon without abscess nor bowel obstruction.
Mild-to-moderate degree of pericolonic inflammation is seen. No free
fluid or free air.

Tiny subcentimeter hepatic hypodensities too small to further
characterize but statistically consistent with cysts or
hemangiomata. No significant change with reference to the more
cephalad right hepatic lobe lesion since 1424 consistent with a
benign finding.

## 2018-07-27 DIAGNOSIS — F900 Attention-deficit hyperactivity disorder, predominantly inattentive type: Secondary | ICD-10-CM | POA: Diagnosis not present

## 2018-10-25 DIAGNOSIS — E291 Testicular hypofunction: Secondary | ICD-10-CM | POA: Diagnosis not present

## 2018-11-01 DIAGNOSIS — E291 Testicular hypofunction: Secondary | ICD-10-CM | POA: Diagnosis not present

## 2018-11-01 DIAGNOSIS — N5201 Erectile dysfunction due to arterial insufficiency: Secondary | ICD-10-CM | POA: Diagnosis not present

## 2018-11-23 DIAGNOSIS — F900 Attention-deficit hyperactivity disorder, predominantly inattentive type: Secondary | ICD-10-CM | POA: Diagnosis not present

## 2018-12-16 DIAGNOSIS — E291 Testicular hypofunction: Secondary | ICD-10-CM | POA: Diagnosis not present

## 2019-02-17 NOTE — Progress Notes (Signed)
TESTOSTERONE, TOTAL 773.5

## 2019-02-21 DIAGNOSIS — F900 Attention-deficit hyperactivity disorder, predominantly inattentive type: Secondary | ICD-10-CM | POA: Diagnosis not present

## 2019-04-19 DIAGNOSIS — Z9189 Other specified personal risk factors, not elsewhere classified: Secondary | ICD-10-CM | POA: Diagnosis not present

## 2019-04-19 DIAGNOSIS — R05 Cough: Secondary | ICD-10-CM | POA: Diagnosis not present

## 2019-05-02 DIAGNOSIS — E291 Testicular hypofunction: Secondary | ICD-10-CM | POA: Diagnosis not present

## 2019-05-02 DIAGNOSIS — Z125 Encounter for screening for malignant neoplasm of prostate: Secondary | ICD-10-CM | POA: Diagnosis not present

## 2019-05-09 DIAGNOSIS — N5201 Erectile dysfunction due to arterial insufficiency: Secondary | ICD-10-CM | POA: Diagnosis not present

## 2019-05-09 DIAGNOSIS — E291 Testicular hypofunction: Secondary | ICD-10-CM | POA: Diagnosis not present

## 2019-05-09 DIAGNOSIS — Z125 Encounter for screening for malignant neoplasm of prostate: Secondary | ICD-10-CM | POA: Diagnosis not present

## 2019-06-22 DIAGNOSIS — F902 Attention-deficit hyperactivity disorder, combined type: Secondary | ICD-10-CM | POA: Diagnosis not present

## 2019-06-22 DIAGNOSIS — Z79899 Other long term (current) drug therapy: Secondary | ICD-10-CM | POA: Diagnosis not present

## 2019-07-17 ENCOUNTER — Ambulatory Visit: Payer: BC Managed Care – PPO | Attending: Internal Medicine

## 2019-07-17 DIAGNOSIS — Z23 Encounter for immunization: Secondary | ICD-10-CM

## 2019-07-17 NOTE — Progress Notes (Signed)
   Covid-19 Vaccination Clinic  Name:  KNOLAN FALZON    MRN: [id number] DOB: 1970/12/13  07/17/2019  Mr. Brosch was observed post Covid-19 immunization for 15 minutes without incident. He was provided with Vaccine Information Sheet and instruction to access the V-Safe system.   Mr. Flax was instructed to call 911 with any severe reactions post vaccine: Marland Kitchen Difficulty breathing  . Swelling of face and throat  . A fast heartbeat  . A bad rash all over body  . Dizziness and weakness   Immunizations Administered    Name Date Dose VIS Date Route   Pfizer COVID-19 Vaccine 07/17/2019  5:18 PM 0.3 mL 04/22/2019 Intramuscular   Manufacturer: [place]   Lot: [id number]   [place]: [id number]

## 2019-08-16 ENCOUNTER — Ambulatory Visit: Payer: BC Managed Care – PPO | Attending: Internal Medicine

## 2019-08-16 ENCOUNTER — Other Ambulatory Visit: Payer: Self-pay

## 2019-08-16 DIAGNOSIS — Z23 Encounter for immunization: Secondary | ICD-10-CM

## 2019-08-16 NOTE — Progress Notes (Signed)
   Covid-19 Vaccination Clinic  Name:  Jesse Steele    MRN: [id number] DOB: 08-07-1970  08/16/2019  Mr. [name] was observed post Covid-19 immunization for 15 minutes without incident. He was provided with Vaccine Information Sheet and instruction to access the V-Safe system.   Mr. [name] was instructed to call 911 with any severe reactions post vaccine: Marland Kitchen Difficulty breathing  . Swelling of face and throat  . A fast heartbeat  . A bad rash all over body  . Dizziness and weakness   Immunizations Administered    Name Date Dose VIS Date Route   Pfizer COVID-19 Vaccine 08/16/2019  2:55 PM 0.3 mL 04/22/2019 Intramuscular   Manufacturer: [place]   Lot: [id number]   [place]: [id number]

## 2019-08-17 ENCOUNTER — Encounter: Payer: Self-pay | Admitting: Podiatry

## 2019-08-17 ENCOUNTER — Ambulatory Visit: Payer: BLUE CROSS/BLUE SHIELD | Admitting: Podiatry

## 2019-08-17 ENCOUNTER — Ambulatory Visit (INDEPENDENT_AMBULATORY_CARE_PROVIDER_SITE_OTHER): Payer: BC Managed Care – PPO

## 2019-08-17 ENCOUNTER — Other Ambulatory Visit: Payer: Self-pay

## 2019-08-17 ENCOUNTER — Other Ambulatory Visit: Payer: Self-pay | Admitting: Podiatry

## 2019-08-17 ENCOUNTER — Ambulatory Visit (INDEPENDENT_AMBULATORY_CARE_PROVIDER_SITE_OTHER): Payer: Self-pay

## 2019-08-17 VITALS — Temp 96.8°F

## 2019-08-17 DIAGNOSIS — M25571 Pain in right ankle and joints of right foot: Secondary | ICD-10-CM

## 2019-08-17 DIAGNOSIS — M79671 Pain in right foot: Secondary | ICD-10-CM

## 2019-08-17 DIAGNOSIS — S93401A Sprain of unspecified ligament of right ankle, initial encounter: Secondary | ICD-10-CM

## 2019-08-17 DIAGNOSIS — M79672 Pain in left foot: Secondary | ICD-10-CM

## 2019-08-17 MED ORDER — MELOXICAM 15 MG PO TABS: 15.0000 mg | ORAL_TABLET | Freq: Every day | ORAL | 1 refills | Status: DC

## 2019-08-17 MED ORDER — MELOXICAM POWD
15.0000 mg | Freq: Every day | 0 refills | Status: DC
Start: 2019-08-17 — End: 2021-11-08

## 2019-08-19 ENCOUNTER — Other Ambulatory Visit: Payer: Self-pay | Admitting: Podiatry

## 2019-08-19 DIAGNOSIS — S93401A Sprain of unspecified ligament of right ankle, initial encounter: Secondary | ICD-10-CM

## 2019-08-19 NOTE — Progress Notes (Signed)
   Subjective:  49 y.o. male presenting today as a new patient with a chief complaint of sharp pain to the dorsal right foot and ankle that began three weeks ago after an injury. He states he was playing basketball and landed on the foot wrong. Applying pressure to the foot, flexing the ankle and walking increases the pain. He has been using arch supports and applying BioFreeze for treatment. Patient is here for further evaluation and treatment.   Past Medical History:  Diagnosis Date  . ADHD (attention deficit hyperactivity disorder)   . Anxiety   . Diverticulosis   . Esophageal stricture   . GERD (gastroesophageal reflux disease)   . Liver lesion   . Low testosterone   . Lumbar degenerative disc disease      Objective / Physical Exam:  General:  The patient is alert and oriented x3 in no acute distress. Dermatology:  Skin is warm, dry and supple bilateral lower extremities. Negative for open lesions or macerations. Vascular:  Palpable pedal pulses bilaterally. No edema or erythema noted. Capillary refill within normal limits. Neurological:  Epicritic and protective threshold grossly intact bilaterally.  Musculoskeletal Exam:  Pain on palpation to the medial aspects of the patient's right ankle. Mild edema noted. Range of motion within normal limits to all pedal and ankle joints bilateral. Muscle strength 5/5 in all groups bilateral.   Radiographic Exam:  Normal osseous mineralization. Joint spaces preserved. No fracture/dislocation/boney destruction.    Assessment: 1. Right ankle sprain / ankle synovitis - medial aspect   Plan of Care:  1. Patient was evaluated. X-Rays reviewed.  2. Injection of 0.5 mL Celestone Soluspan injected in the patient's right ankle. 3. CAM boot dispensed.  4. Prescription for Meloxicam provided to patient. 5. Return to clinic in 3 weeks.   Treated his son.    Edrick Kins, DPM [facility]  Dr. Edrick Kins, [address]                                        [place], [place] [address]                Office [phone]  Fax [phone]

## 2019-09-07 ENCOUNTER — Other Ambulatory Visit: Payer: Self-pay

## 2019-09-07 ENCOUNTER — Ambulatory Visit: Payer: BC Managed Care – PPO | Admitting: Podiatry

## 2019-09-07 ENCOUNTER — Encounter: Payer: Self-pay | Admitting: Podiatry

## 2019-09-07 DIAGNOSIS — M2141 Flat foot [pes planus] (acquired), right foot: Secondary | ICD-10-CM

## 2019-09-07 DIAGNOSIS — M659 Synovitis and tenosynovitis, unspecified: Secondary | ICD-10-CM

## 2019-09-07 DIAGNOSIS — M2142 Flat foot [pes planus] (acquired), left foot: Secondary | ICD-10-CM

## 2019-09-07 DIAGNOSIS — S93401D Sprain of unspecified ligament of right ankle, subsequent encounter: Secondary | ICD-10-CM

## 2019-09-09 NOTE — Progress Notes (Signed)
   Subjective:  49 y.o. male presenting today for follow up evaluation of right ankle pain. He reports some improvement but still has some throbbing pain intermittently. He states the injection only provided about 24 hours of relief. He has been taking Meloxicam and using the CAM boot which helps ease his pain. Patient is here for further evaluation and treatment.   Past Medical History:  Diagnosis Date  . ADHD (attention deficit hyperactivity disorder)   . Anxiety   . Diverticulosis   . Esophageal stricture   . GERD (gastroesophageal reflux disease)   . Liver lesion   . Low testosterone   . Lumbar degenerative disc disease        Objective/Physical Exam General: The patient is alert and oriented x3 in no acute distress.  Dermatology: Skin is warm, dry and supple bilateral lower extremities. Negative for open lesions or macerations.  Vascular: Palpable pedal pulses bilaterally. No edema or erythema noted. Capillary refill within normal limits.  Neurological: Epicritic and protective threshold grossly intact bilaterally.   Musculoskeletal Exam: Range of motion within normal limits to all pedal and ankle joints bilateral. Muscle strength 5/5 in all groups bilateral.  Upon weightbearing there is a medial longitudinal arch collapse bilaterally. Remove foot valgus noted to the bilateral lower extremities with excessive pronation upon mid stance. Pain on palpation to the medial aspects of the patient's right ankle. Mild edema noted.  Assessment: 1. pes planus bilateral 2. Right ankle sprain / ankle synovitis - medial aspect  3. Deltoid ligament sprain right   Plan of Care:  1. Patient was evaluated.  2. Injection of 0.5 mLs Celestone Soluspan injected into the right ankle joint 3. Ankle brace dispensed.  4. Appointment with Pedorthist for custom molded orthotics.  5. Continue taking OTC Motrin.  6. Return to clinic in 3 weeks.    Edrick Kins, DPM [facility]  Dr. Edrick Kins, [address]                                        [place], [place] [address]                Office [phone]  Fax [phone]

## 2019-09-19 DIAGNOSIS — F419 Anxiety disorder, unspecified: Secondary | ICD-10-CM | POA: Diagnosis not present

## 2019-09-19 DIAGNOSIS — F900 Attention-deficit hyperactivity disorder, predominantly inattentive type: Secondary | ICD-10-CM | POA: Diagnosis not present

## 2019-09-26 ENCOUNTER — Encounter: Payer: Self-pay | Admitting: Podiatry

## 2019-09-26 ENCOUNTER — Ambulatory Visit: Payer: BC Managed Care – PPO | Admitting: Podiatry

## 2019-09-26 ENCOUNTER — Other Ambulatory Visit: Payer: Self-pay

## 2019-09-26 DIAGNOSIS — M659 Synovitis and tenosynovitis, unspecified: Secondary | ICD-10-CM

## 2019-09-26 DIAGNOSIS — M2141 Flat foot [pes planus] (acquired), right foot: Secondary | ICD-10-CM | POA: Diagnosis not present

## 2019-09-26 DIAGNOSIS — S93401D Sprain of unspecified ligament of right ankle, subsequent encounter: Secondary | ICD-10-CM | POA: Diagnosis not present

## 2019-09-26 DIAGNOSIS — M2142 Flat foot [pes planus] (acquired), left foot: Secondary | ICD-10-CM | POA: Diagnosis not present

## 2019-09-28 NOTE — Progress Notes (Signed)
   Subjective:  49 y.o. male presenting today for follow up evaluation of right ankle pain. He states he is doing well and reports a decrease in the swelling. He has been using the ankle brace as directed as well as taking Motrin for pain. There are no worsening factors noted at this time. Patient is here for further evaluation and treatment.   Past Medical History:  Diagnosis Date  . ADHD (attention deficit hyperactivity disorder)   . Anxiety   . Diverticulosis   . Esophageal stricture   . GERD (gastroesophageal reflux disease)   . Liver lesion   . Low testosterone   . Lumbar degenerative disc disease        Objective/Physical Exam General: The patient is alert and oriented x3 in no acute distress.  Dermatology: Skin is warm, dry and supple bilateral lower extremities. Negative for open lesions or macerations.  Vascular: Palpable pedal pulses bilaterally. No edema or erythema noted. Capillary refill within normal limits.  Neurological: Epicritic and protective threshold grossly intact bilaterally.   Musculoskeletal Exam: Range of motion within normal limits to all pedal and ankle joints bilateral. Muscle strength 5/5 in all groups bilateral.  Upon weightbearing there is a medial longitudinal arch collapse bilaterally. Remove foot valgus noted to the bilateral lower extremities with excessive pronation upon mid stance. Pain on palpation to the medial aspects of the patient's right ankle. Mild edema noted.  Assessment: 1. pes planus bilateral 2. Right ankle sprain / ankle synovitis - medial aspect   Plan of Care:  1. Patient was evaluated.  2. Continue using ankle brace.  3. Continue taking Motrin as needed.  4. Appointment with Pedorthist pending. In the meantime, continue using custom orthotics from 2014.  5. May resume full activity with no restrictions.  6. Return to clinic as needed.     Edrick Kins, DPM [facility]  Dr. Edrick Kins, [address]                                        [place], [place] [address]                Office [phone]  Fax [phone]

## 2019-10-17 ENCOUNTER — Other Ambulatory Visit: Payer: BC Managed Care – PPO | Admitting: Orthotics

## 2019-10-17 ENCOUNTER — Other Ambulatory Visit: Payer: Self-pay

## 2019-10-31 DIAGNOSIS — E291 Testicular hypofunction: Secondary | ICD-10-CM | POA: Diagnosis not present

## 2019-11-07 DIAGNOSIS — E291 Testicular hypofunction: Secondary | ICD-10-CM | POA: Diagnosis not present

## 2019-11-07 DIAGNOSIS — N528 Other male erectile dysfunction: Secondary | ICD-10-CM | POA: Diagnosis not present

## 2019-12-28 IMAGING — DX DG CHEST 2V
2 series · 2 of 2 positions shown · non-contrast
Comparison: 09/05/2013.

CLINICAL DATA: Productive cough.  Fever.

EXAM:
CHEST - 2 VIEW

[chest pa]
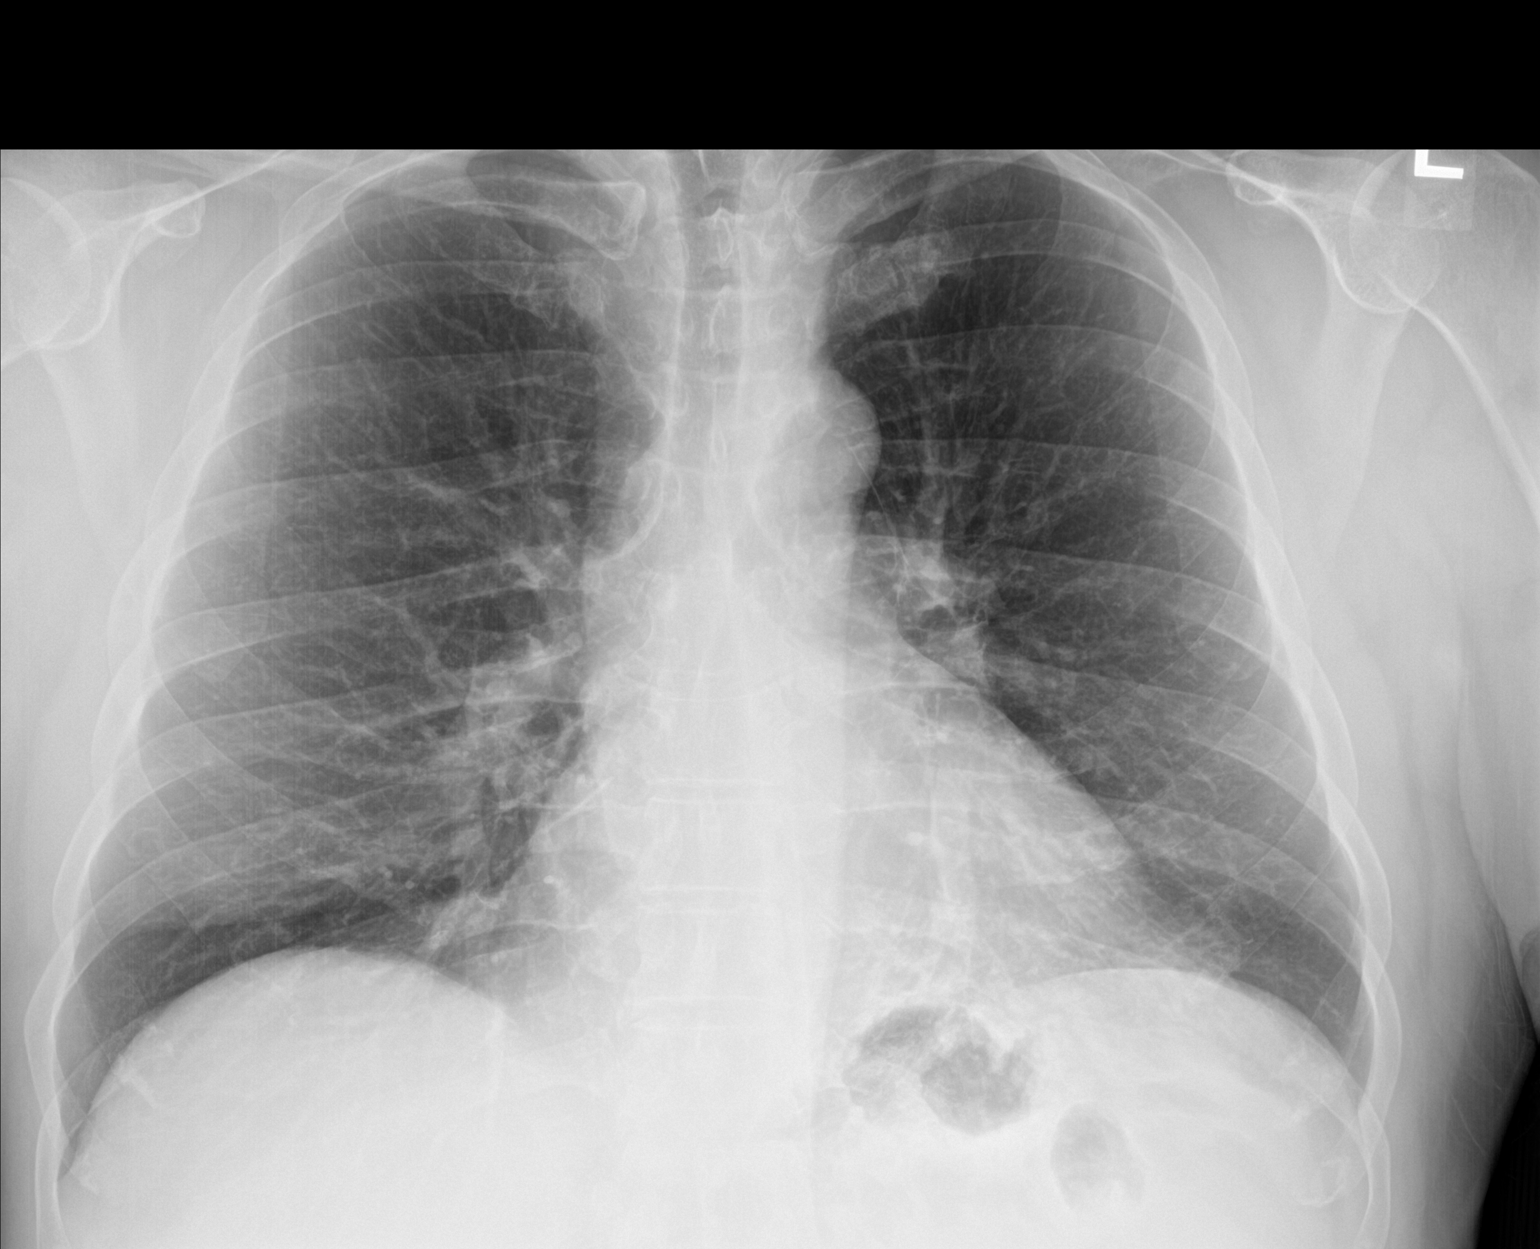

[chest lat]
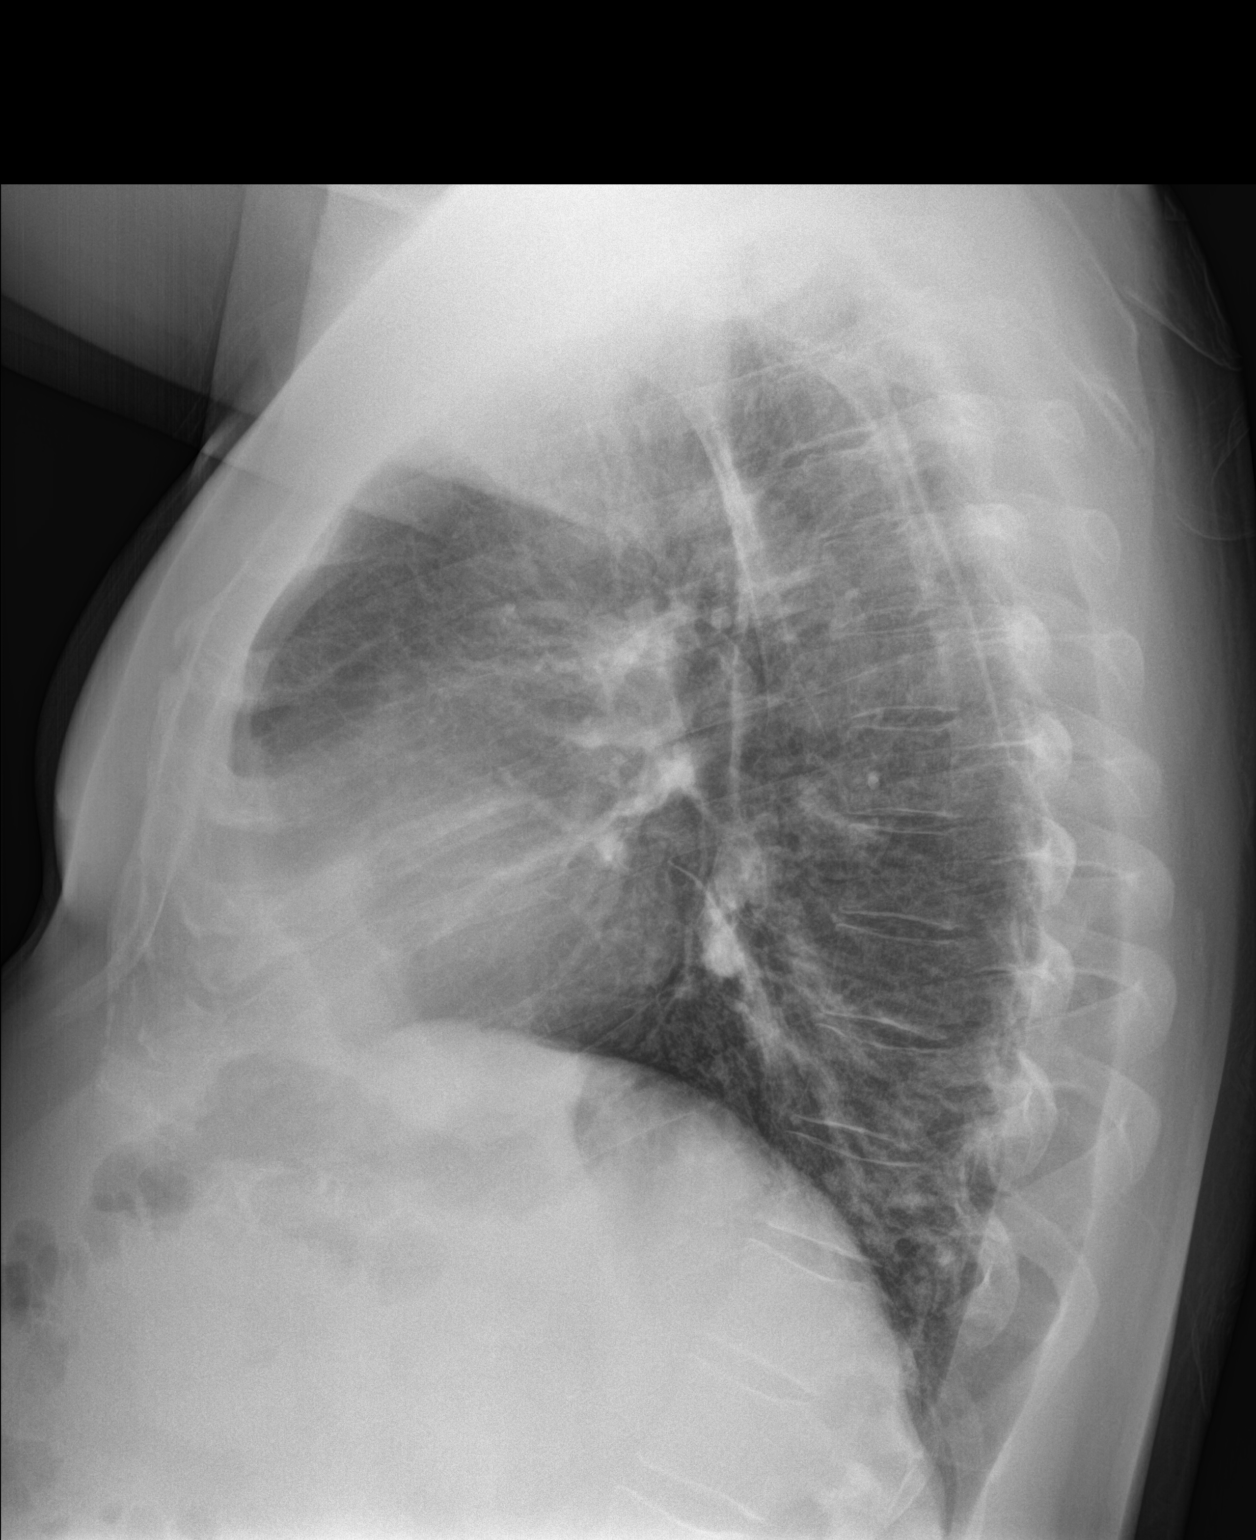

[2 of 2 positions shown; findings below may reference images not displayed]

FINDINGS: Mediastinum hilar structures normal. Heart size stable. Low lung
volumes. Mild bibasilar infiltrates. No pleural effusion or
pneumothorax. No acute bony abnormality.
IMPRESSION: Low lung volumes with mild bibasilar pulmonary infiltrates.

## 2020-01-13 DIAGNOSIS — Z79899 Other long term (current) drug therapy: Secondary | ICD-10-CM | POA: Diagnosis not present

## 2020-01-13 DIAGNOSIS — F902 Attention-deficit hyperactivity disorder, combined type: Secondary | ICD-10-CM | POA: Diagnosis not present

## 2020-01-13 DIAGNOSIS — F419 Anxiety disorder, unspecified: Secondary | ICD-10-CM | POA: Diagnosis not present

## 2020-03-06 DIAGNOSIS — Z20822 Contact with and (suspected) exposure to covid-19: Secondary | ICD-10-CM | POA: Diagnosis not present

## 2020-03-24 ENCOUNTER — Ambulatory Visit: Payer: BC Managed Care – PPO | Attending: Internal Medicine

## 2020-03-24 DIAGNOSIS — Z23 Encounter for immunization: Secondary | ICD-10-CM

## 2020-03-24 NOTE — Progress Notes (Signed)
   Covid-19 Vaccination Clinic  Name:  Jesse Steele    MRN: [id number] DOB: 04-25-1971  03/24/2020  Mr. [name] was observed post Covid-19 immunization for 15 minutes without incident. He was provided with Vaccine Information Sheet and instruction to access the V-Safe system.   Mr. [name] was instructed to call 911 with any severe reactions post vaccine: Marland Kitchen Difficulty breathing  . Swelling of face and throat  . A fast heartbeat  . A bad rash all over body  . Dizziness and weakness   Immunizations Administered    Name Date Dose VIS Date Route   Pfizer COVID-19 Vaccine 03/24/2020  2:12 PM 0.3 mL 02/29/2020 Intramuscular   Manufacturer: [place]   Lot: [id number]   [place]: [id number]

## 2020-04-25 DIAGNOSIS — Z125 Encounter for screening for malignant neoplasm of prostate: Secondary | ICD-10-CM | POA: Diagnosis not present

## 2020-04-25 DIAGNOSIS — E291 Testicular hypofunction: Secondary | ICD-10-CM | POA: Diagnosis not present

## 2020-05-01 DIAGNOSIS — E291 Testicular hypofunction: Secondary | ICD-10-CM | POA: Diagnosis not present

## 2020-05-09 DIAGNOSIS — F902 Attention-deficit hyperactivity disorder, combined type: Secondary | ICD-10-CM | POA: Diagnosis not present

## 2020-05-09 DIAGNOSIS — F411 Generalized anxiety disorder: Secondary | ICD-10-CM | POA: Diagnosis not present

## 2020-05-09 DIAGNOSIS — Z79899 Other long term (current) drug therapy: Secondary | ICD-10-CM | POA: Diagnosis not present

## 2020-08-02 DIAGNOSIS — F411 Generalized anxiety disorder: Secondary | ICD-10-CM | POA: Diagnosis not present

## 2020-08-02 DIAGNOSIS — Z79899 Other long term (current) drug therapy: Secondary | ICD-10-CM | POA: Diagnosis not present

## 2020-08-02 DIAGNOSIS — F902 Attention-deficit hyperactivity disorder, combined type: Secondary | ICD-10-CM | POA: Diagnosis not present

## 2020-08-23 DIAGNOSIS — Z20822 Contact with and (suspected) exposure to covid-19: Secondary | ICD-10-CM | POA: Diagnosis not present

## 2020-10-23 DIAGNOSIS — E291 Testicular hypofunction: Secondary | ICD-10-CM | POA: Diagnosis not present

## 2020-10-26 DIAGNOSIS — Z79899 Other long term (current) drug therapy: Secondary | ICD-10-CM | POA: Diagnosis not present

## 2020-10-26 DIAGNOSIS — F411 Generalized anxiety disorder: Secondary | ICD-10-CM | POA: Diagnosis not present

## 2020-10-26 DIAGNOSIS — F902 Attention-deficit hyperactivity disorder, combined type: Secondary | ICD-10-CM | POA: Diagnosis not present

## 2020-10-30 DIAGNOSIS — E291 Testicular hypofunction: Secondary | ICD-10-CM | POA: Diagnosis not present

## 2020-10-30 DIAGNOSIS — N528 Other male erectile dysfunction: Secondary | ICD-10-CM | POA: Diagnosis not present

## 2020-11-22 DIAGNOSIS — H1132 Conjunctival hemorrhage, left eye: Secondary | ICD-10-CM | POA: Diagnosis not present

## 2020-12-09 DIAGNOSIS — U071 COVID-19: Secondary | ICD-10-CM | POA: Diagnosis not present

## 2020-12-09 DIAGNOSIS — Z20822 Contact with and (suspected) exposure to covid-19: Secondary | ICD-10-CM | POA: Diagnosis not present

## 2021-02-18 DIAGNOSIS — F411 Generalized anxiety disorder: Secondary | ICD-10-CM | POA: Diagnosis not present

## 2021-02-18 DIAGNOSIS — Z79899 Other long term (current) drug therapy: Secondary | ICD-10-CM | POA: Diagnosis not present

## 2021-02-18 DIAGNOSIS — F902 Attention-deficit hyperactivity disorder, combined type: Secondary | ICD-10-CM | POA: Diagnosis not present

## 2021-04-22 DIAGNOSIS — R6883 Chills (without fever): Secondary | ICD-10-CM | POA: Diagnosis not present

## 2021-04-22 DIAGNOSIS — R059 Cough, unspecified: Secondary | ICD-10-CM | POA: Diagnosis not present

## 2021-04-22 DIAGNOSIS — J111 Influenza due to unidentified influenza virus with other respiratory manifestations: Secondary | ICD-10-CM | POA: Diagnosis not present

## 2021-04-22 DIAGNOSIS — R509 Fever, unspecified: Secondary | ICD-10-CM | POA: Diagnosis not present

## 2021-04-25 DIAGNOSIS — E291 Testicular hypofunction: Secondary | ICD-10-CM | POA: Diagnosis not present

## 2021-04-25 DIAGNOSIS — Z125 Encounter for screening for malignant neoplasm of prostate: Secondary | ICD-10-CM | POA: Diagnosis not present

## 2021-05-02 DIAGNOSIS — N528 Other male erectile dysfunction: Secondary | ICD-10-CM | POA: Diagnosis not present

## 2021-05-02 DIAGNOSIS — E291 Testicular hypofunction: Secondary | ICD-10-CM | POA: Diagnosis not present

## 2021-06-17 DIAGNOSIS — F411 Generalized anxiety disorder: Secondary | ICD-10-CM | POA: Diagnosis not present

## 2021-06-17 DIAGNOSIS — Z79899 Other long term (current) drug therapy: Secondary | ICD-10-CM | POA: Diagnosis not present

## 2021-06-17 DIAGNOSIS — F902 Attention-deficit hyperactivity disorder, combined type: Secondary | ICD-10-CM | POA: Diagnosis not present

## 2021-07-09 DIAGNOSIS — Z13220 Encounter for screening for lipoid disorders: Secondary | ICD-10-CM | POA: Diagnosis not present

## 2021-07-09 DIAGNOSIS — Z713 Dietary counseling and surveillance: Secondary | ICD-10-CM | POA: Diagnosis not present

## 2021-07-09 DIAGNOSIS — Z136 Encounter for screening for cardiovascular disorders: Secondary | ICD-10-CM | POA: Diagnosis not present

## 2021-07-09 DIAGNOSIS — E7800 Pure hypercholesterolemia, unspecified: Secondary | ICD-10-CM | POA: Diagnosis not present

## 2021-07-09 DIAGNOSIS — R030 Elevated blood-pressure reading, without diagnosis of hypertension: Secondary | ICD-10-CM | POA: Diagnosis not present

## 2021-09-09 DIAGNOSIS — Z79899 Other long term (current) drug therapy: Secondary | ICD-10-CM | POA: Diagnosis not present

## 2021-09-09 DIAGNOSIS — F902 Attention-deficit hyperactivity disorder, combined type: Secondary | ICD-10-CM | POA: Diagnosis not present

## 2021-09-09 DIAGNOSIS — F411 Generalized anxiety disorder: Secondary | ICD-10-CM | POA: Diagnosis not present

## 2021-09-27 DIAGNOSIS — M79672 Pain in left foot: Secondary | ICD-10-CM | POA: Diagnosis not present

## 2021-09-27 DIAGNOSIS — M79671 Pain in right foot: Secondary | ICD-10-CM | POA: Diagnosis not present

## 2021-09-27 DIAGNOSIS — M76821 Posterior tibial tendinitis, right leg: Secondary | ICD-10-CM | POA: Insufficient documentation

## 2021-09-27 DIAGNOSIS — M25572 Pain in left ankle and joints of left foot: Secondary | ICD-10-CM | POA: Diagnosis not present

## 2021-09-27 DIAGNOSIS — M25571 Pain in right ankle and joints of right foot: Secondary | ICD-10-CM | POA: Diagnosis not present

## 2021-10-10 DIAGNOSIS — M79671 Pain in right foot: Secondary | ICD-10-CM | POA: Diagnosis not present

## 2021-10-15 DIAGNOSIS — M79671 Pain in right foot: Secondary | ICD-10-CM | POA: Insufficient documentation

## 2021-10-16 DIAGNOSIS — M79671 Pain in right foot: Secondary | ICD-10-CM | POA: Diagnosis not present

## 2021-10-23 DIAGNOSIS — M79671 Pain in right foot: Secondary | ICD-10-CM | POA: Diagnosis not present

## 2021-10-24 DIAGNOSIS — E291 Testicular hypofunction: Secondary | ICD-10-CM | POA: Diagnosis not present

## 2021-10-30 DIAGNOSIS — M79671 Pain in right foot: Secondary | ICD-10-CM | POA: Diagnosis not present

## 2021-11-07 ENCOUNTER — Encounter: Payer: Self-pay | Admitting: *Deleted

## 2021-11-08 ENCOUNTER — Encounter: Payer: Self-pay | Admitting: Gastroenterology

## 2021-11-08 ENCOUNTER — Other Ambulatory Visit (INDEPENDENT_AMBULATORY_CARE_PROVIDER_SITE_OTHER): Payer: BC Managed Care – PPO

## 2021-11-08 ENCOUNTER — Ambulatory Visit: Payer: BC Managed Care – PPO | Admitting: Gastroenterology

## 2021-11-08 VITALS — BP 134/88 | HR 84 | Ht 68.75 in | Wt 231.4 lb

## 2021-11-08 DIAGNOSIS — M6208 Separation of muscle (nontraumatic), other site: Secondary | ICD-10-CM

## 2021-11-08 DIAGNOSIS — K582 Mixed irritable bowel syndrome: Secondary | ICD-10-CM | POA: Diagnosis not present

## 2021-11-08 DIAGNOSIS — K649 Unspecified hemorrhoids: Secondary | ICD-10-CM

## 2021-11-08 DIAGNOSIS — Z1211 Encounter for screening for malignant neoplasm of colon: Secondary | ICD-10-CM

## 2021-11-08 DIAGNOSIS — Z8719 Personal history of other diseases of the digestive system: Secondary | ICD-10-CM

## 2021-11-08 LAB — CBC WITH DIFFERENTIAL/PLATELET
Basophils Absolute: 0 10*3/uL (ref 0.0–0.1)
Basophils Relative: 0.6 % (ref 0.0–3.0)
Eosinophils Absolute: 0.3 10*3/uL (ref 0.0–0.7)
Eosinophils Relative: 3.6 % (ref 0.0–5.0)
HCT: 47.2 % (ref 39.0–52.0)
Hemoglobin: 15.6 g/dL (ref 13.0–17.0)
Lymphocytes Relative: 21.2 % (ref 12.0–46.0)
Lymphs Abs: 1.8 10*3/uL (ref 0.7–4.0)
MCHC: 33.1 g/dL (ref 30.0–36.0)
MCV: 86.6 fl (ref 78.0–100.0)
Monocytes Absolute: 0.7 10*3/uL (ref 0.1–1.0)
Monocytes Relative: 7.9 % (ref 3.0–12.0)
Neutro Abs: 5.7 10*3/uL (ref 1.4–7.7)
Neutrophils Relative %: 66.7 % (ref 43.0–77.0)
Platelets: 209 10*3/uL (ref 150.0–400.0)
RBC: 5.45 Mil/uL (ref 4.22–5.81)
RDW: 14 % (ref 11.5–15.5)
WBC: 8.5 10*3/uL (ref 4.0–10.5)

## 2021-11-08 LAB — COMPREHENSIVE METABOLIC PANEL
ALT: 25 U/L (ref 0–53)
AST: 18 U/L (ref 0–37)
Albumin: 4.4 g/dL (ref 3.5–5.2)
Alkaline Phosphatase: 78 U/L (ref 39–117)
BUN: 12 mg/dL (ref 6–23)
CO2: 27 mEq/L (ref 19–32)
Calcium: 9.3 mg/dL (ref 8.4–10.5)
Chloride: 104 mEq/L (ref 96–112)
Creatinine, Ser: 0.89 mg/dL (ref 0.40–1.50)
GFR: 100.3 mL/min (ref >60.00)
Glucose, Bld: 93 mg/dL (ref 70–99)
Potassium: 4.1 mEq/L (ref 3.5–5.1)
Sodium: 138 mEq/L (ref 135–145)
Total Bilirubin: 0.5 mg/dL (ref 0.2–1.2)
Total Protein: 6.6 g/dL (ref 6.0–8.3)

## 2021-11-08 MED ORDER — NA SULFATE-K SULFATE-MG SULF 17.5-3.13-1.6 GM/177ML PO SOLN: 1.0000 | Freq: Once | ORAL | 0 refills | Status: AC

## 2021-11-08 NOTE — Progress Notes (Signed)
HPI :  51 year old male with a history of diverticulitis, esophageal stricture, anxiety, self referred as a new patient for discussion of possible hernia, bowel changes.  Patient was remotely followed by Dr. Delfin Edis in prior years for dysphagia.  He was last seen in 2016.  This is my first time meeting him.  He reports about 3 days ago when getting out of a hot tub he happened to notice a protrusion from his upper abdomen, concerning for possible hernia.  He denied any pain with this at all.  He thinks 3 days ago is the first time he actually saw this but he is not sure exactly how long its been going on.  He did have a periumbilical surgery done in 2000, no other ventral hernias in his abdomen he was aware of.  Otherwise he states he has a history of IBS longstanding.  He will have anywhere from 4-5 bowel movements per day, occasionally loose, and then he may go a few days with much lower frequency.  He denies any blood in his stools but does have some superficial bleeding from hemorrhoids at times.  He states this is long standing.  He states he sits on the toilet for 20 minutes to 1 hour at a time having a bowel movement.  He is interested in hemorrhoid therapy to reduce the symptoms.  He denies any family history of colon cancer.  His father had prostate cancer.  His grandmother had celiac disease.  He had a episode of diverticulitis in 2018 when he presented with abdominal pain.  CT scan showed acute diverticulitis in the left colon. He has never had a colonoscopy.  He otherwise states he has gained some weight recently.  His father passed away and he is gained a few pounds since that happened.  Otherwise he denies any recurrent dysphagia since his last EGD in 2014.  Denies any reflux symptoms that bother him at this time.  Of note he has not had lab work done in a few years.  He states he does not have a primary care physician anymore and needs to reestablish.  EGD 06/19/2012 - normal,  empiric dilation performed to 64m  EGD 11/03/2011 - "stricture of esophagus" dilated to 180m  CT abdomen  pelvis with contrast 05/2016: IMPRESSION: Acute diverticulitis at the junction of the descending and proximal sigmoid colon without abscess nor bowel obstruction. Mild-to-moderate degree of pericolonic inflammation is seen. No free fluid or free air.   Tiny subcentimeter hepatic hypodensities too small to further characterize but statistically consistent with cysts or hemangiomata. No significant change with reference to the more cephalad right hepatic lobe lesion since 2013 consistent with a benign finding.  Past Medical History:  Diagnosis Date   ADHD (attention deficit hyperactivity disorder)    Anxiety    Diverticulitis    Diverticulosis    Duodenitis    Esophageal stricture    GERD (gastroesophageal reflux disease)    Hiatal hernia    IBS (irritable bowel syndrome)    Liver lesion    Low testosterone    Lumbar degenerative disc disease      Past Surgical History:  Procedure Laterality Date   ADENOIDECTOMY     ESOPHAGOGASTRODUODENOSCOPY  11/03/2011   Procedure: ESOPHAGOGASTRODUODENOSCOPY (EGD);  Surgeon: DoLafayette DragonMD;  [facility];  Service: Endoscopy;  Laterality: N/A;   SAVORY DILATION  11/03/2011   Procedure: SAVORY DILATION;  Surgeon: DoLafayette DragonMD;  [facility];  Service:  Endoscopy;  Laterality: N/A;   TONSILLECTOMY     UMBILICAL HERNIA REPAIR     UPPER GASTROINTESTINAL ENDOSCOPY     Family History  Problem Relation Age of Onset   COPD Mother    Heart disease Mother    Hypertension Mother    Heart failure Mother    Prostate cancer Father    Hypertension Father    COPD Father    Pneumonia Father    Heart failure Father    Gallstones Father    Hypertension Sister    Gallstones Sister    Heart disease Maternal Grandmother    Kidney disease Paternal Grandmother    Colon cancer Neg Hx    Social History   Tobacco  Use   Smoking status: Never   Smokeless tobacco: Never  Vaping Use   Vaping Use: Never used  Substance Use Topics   Alcohol use: Yes    Alcohol/week: 2.0 standard drinks of alcohol    Types: 2 Standard drinks or equivalent per week    Comment: 1 every 4 days and weekends   Drug use: No   Current Outpatient Medications  Medication Sig Dispense Refill   lisdexamfetamine (VYVANSE) 50 MG capsule Take by mouth.     Testosterone 20.25 MG/ACT (1.62%) GEL SMARTSIG:1 Pump Topical Daily     No current facility-administered medications for this visit.   Allergies  Allergen Reactions   Codeine Other (See Comments)    Reaction unknown (family allergy)   Penicillins Nausea Only   Sulfa Antibiotics Nausea Only     Review of Systems: All systems reviewed and negative except where noted in HPI.    Physical Exam: BP 134/88 (BP Location: Left Arm, Patient Position: Sitting, Cuff Size: Normal)   Pulse 84   Ht 5' 8.75" (1.746 m) Comment: height measured without shoes  Wt 231 lb 6 oz (105 kg)   BMI 34.42 kg/m  Constitutional: Pleasant,well-developed, male in no acute distress. HEENT: Normocephalic and atraumatic. Conjunctivae are normal. No scleral icterus. Neck supple.  Cardiovascular: Normal rate, regular rhythm.  Pulmonary/chest: Effort normal and breath sounds normal. No wheezing, rales or rhonchi. Abdominal: Soft, nondistended but protuberant, (+) diastasis recti upper abdomen. nontender. There are no masses palpable. No hepatomegaly. Extremities: no edema Lymphadenopathy: No cervical adenopathy noted. Neurological: Alert and oriented to person place and time. Skin: Skin is warm and dry. No rashes noted. Psychiatric: Normal mood and affect. Behavior is normal.   ASSESSMENT AND PLAN: 51 year old male here for new patient assessment of the following:  Diastases recti History of diverticulitis IBS Hemorrhoids Colon cancer screening  Patient has a moderate diastases recti  upper abdominal hernia.  Unclear how long this has been going on.  I described to him what this is.  He has no abdominal pain from it otherwise.  As long as he has no pain from it and remains asymptomatic, I would monitor.  If it enlarges or really bothersome then can refer to a surgeon to discuss elective repair.  Hopefully that is not needed.  Reassured him.  He has a longstanding history of suspected IBS, but does have a history of diverticulitis in 2018 and he has never had a prior colonoscopy to follow that up.  He is also overdue for routine screening purposes.  I recommend a colonoscopy to further evaluate all of these issues.  I discussed what this is with him, risks and benefits, he wants to proceed.  He does have symptomatic hemorrhoids that bother him.  Assuming  no other concerning pathology on colonoscopy, I offered him hemorrhoid banding to treat this.  We will wait for his colonoscopy to be done first.  He agrees.  Otherwise he has not had lab work done in a few years.  He has no primary care physician.  I will refer him to [facility] so he can establish care.  We will do CBC and CMET today to make sure okay, I also offered to screen him for celiac disease given his family history of this and his occasional loose stools.  He agreed  Plan: - reassured him about diastasis recti - if worsening or enlarging he will let me know - schedule colonoscopy at the [facility] - lab for CBC, CMET, celiac labs - anticipate scheduling for hemorrhoid banding after his colonoscopy is done - referral to [facility] primary care so he can establish care  [name], MD [facility]

## 2021-11-08 NOTE — Patient Instructions (Addendum)
  If you are age 50 or older, your body mass index should be between 23-30. Your Body mass index is 34.42 kg/m. If this is out of the aforementioned range listed, please consider follow up with your Primary Care Provider.  If you are age 50 or younger, your body mass index should be between 19-25. Your Body mass index is 34.42 kg/m. If this is out of the aformentioned range listed, please consider follow up with your Primary Care Provider.   ________________________________________________________  Please go to the lab in the basement of our building to have lab work done as you leave today. Hit "B" for basement when you get on the elevator.  When the doors open the lab is on your left.  We will call you with the results. Thank you.  We are referring you to Primary Care.  They will contact you directly to schedule an appointment.  It may take a week or more before you hear from them.  Please feel free to contact us if you have not heard from them within 2 weeks and we will follow up on the referral.   You have been scheduled for a colonoscopy on 12-06-21. Please follow written instructions given to you at your visit today.  Please pick up your prep supplies at the pharmacy within the next 1-3 days. If you use inhalers (even only as needed), please bring them with you on the day of your procedure.  Thank you for entrusting me with your care and for choosing [facility], Dr. Mayo Cellar

## 2021-11-11 LAB — IGA: Immunoglobulin A: 221 mg/dL (ref 47–310)

## 2021-11-11 LAB — TISSUE TRANSGLUTAMINASE, IGA: (tTG) Ab, IgA: <1 U/mL

## 2021-11-18 DIAGNOSIS — M65861 Other synovitis and tenosynovitis, right lower leg: Secondary | ICD-10-CM | POA: Diagnosis not present

## 2021-11-18 DIAGNOSIS — M79671 Pain in right foot: Secondary | ICD-10-CM | POA: Diagnosis not present

## 2021-11-29 ENCOUNTER — Encounter: Payer: Self-pay | Admitting: Gastroenterology

## 2021-12-06 ENCOUNTER — Ambulatory Visit (AMBULATORY_SURGERY_CENTER): Payer: BC Managed Care – PPO | Admitting: Gastroenterology

## 2021-12-06 ENCOUNTER — Encounter: Payer: Self-pay | Admitting: Gastroenterology

## 2021-12-06 VITALS — BP 116/74 | HR 57 | Temp 98.0°F | Resp 12 | Ht 68.0 in | Wt 231.0 lb

## 2021-12-06 DIAGNOSIS — Z1211 Encounter for screening for malignant neoplasm of colon: Secondary | ICD-10-CM

## 2021-12-06 DIAGNOSIS — K5730 Diverticulosis of large intestine without perforation or abscess without bleeding: Secondary | ICD-10-CM

## 2021-12-06 DIAGNOSIS — Z8719 Personal history of other diseases of the digestive system: Secondary | ICD-10-CM

## 2021-12-06 DIAGNOSIS — D124 Benign neoplasm of descending colon: Secondary | ICD-10-CM | POA: Diagnosis not present

## 2021-12-06 DIAGNOSIS — K649 Unspecified hemorrhoids: Secondary | ICD-10-CM | POA: Diagnosis not present

## 2021-12-06 DIAGNOSIS — D120 Benign neoplasm of cecum: Secondary | ICD-10-CM | POA: Diagnosis not present

## 2021-12-06 DIAGNOSIS — K582 Mixed irritable bowel syndrome: Secondary | ICD-10-CM | POA: Diagnosis not present

## 2021-12-06 DIAGNOSIS — D122 Benign neoplasm of ascending colon: Secondary | ICD-10-CM

## 2021-12-06 MED ORDER — SODIUM CHLORIDE 0.9 % IV SOLN: 500.0000 mL | Freq: Once | INTRAVENOUS | Status: DC

## 2021-12-06 NOTE — Progress Notes (Signed)
Called to room to assist during endoscopic procedure.  Patient ID and intended procedure confirmed with present staff. Received instructions for my participation in the procedure from the performing physician.  

## 2021-12-06 NOTE — Progress Notes (Signed)
[facility] History and Physical   Primary Care Physician:  Jesse Moron, MD   Reason for Procedure:   Colon cancer screening, history of diverticulitis  Plan:    colonoscopy     HPI: Jesse Steele is a 51 y.o. male  here for colonoscopy  - has never had an exam before, history of diverticulitis reported. Has baseline IBS with some loose stools. No family history of colon cancer known. Otherwise feels well without any cardiopulmonary symptoms.   I have discussed risks / benefits of anesthesia and endoscopic procedure with Jesse Steele and they wish to proceed with the exams as outlined today.    Past Medical History:  Diagnosis Date   ADHD (attention deficit hyperactivity disorder)    Anxiety    Diverticulitis    Diverticulosis    Duodenitis    Esophageal stricture    GERD (gastroesophageal reflux disease)    Hiatal hernia    IBS (irritable bowel syndrome)    Liver lesion    Low testosterone    Lumbar degenerative disc disease     Past Surgical History:  Procedure Laterality Date   ADENOIDECTOMY     ESOPHAGOGASTRODUODENOSCOPY  11/03/2011   Procedure: ESOPHAGOGASTRODUODENOSCOPY (EGD);  Surgeon: Jesse Dragon, MD;  [facility];  Service: Endoscopy;  Laterality: N/A;   SAVORY DILATION  11/03/2011   Procedure: SAVORY DILATION;  Surgeon: Jesse Dragon, MD;  [facility];  Service: Endoscopy;  Laterality: N/A;   TONSILLECTOMY     UMBILICAL HERNIA REPAIR     UPPER GASTROINTESTINAL ENDOSCOPY      Prior to Admission medications   Medication Sig Start Date End Date Taking? Authorizing Provider  lisdexamfetamine (VYVANSE) 50 MG capsule Take by mouth. 12/23/17  Yes [provider]  Testosterone 20.25 MG/ACT (1.62%) GEL SMARTSIG:1 Pump Topical Daily 07/11/19  Yes [provider]    Current Outpatient Medications  Medication Sig Dispense Refill   lisdexamfetamine (VYVANSE) 50 MG capsule Take by mouth.     Testosterone  20.25 MG/ACT (1.62%) GEL SMARTSIG:1 Pump Topical Daily     Current Facility-Administered Medications  Medication Dose Route Frequency Provider Last Rate Last Admin   0.9 %  sodium chloride infusion  500 mL Intravenous Once Jesse Steele, Jesse Raspberry, MD        Allergies as of 12/06/2021 - Review Complete 12/06/2021  Allergen Reaction Noted   Codeine Other (See Comments) 05/10/2012   Penicillins Nausea Only 10/19/2011   Sulfa antibiotics Nausea Only 10/19/2011    Family History  Problem Relation Age of Onset   COPD Mother    Heart disease Mother    Hypertension Mother    Heart failure Mother    Prostate cancer Father    Hypertension Father    COPD Father    Pneumonia Father    Heart failure Father    Gallstones Father    Hypertension Sister    Gallstones Sister    Heart disease Maternal Grandmother    Kidney disease Paternal Grandmother    Colon cancer Neg Hx     Social History   Socioeconomic History   Marital status: Married    Spouse name: Not on file   Number of children: 1   Years of education: Not on file   Highest education level: Not on file  Occupational History   Occupation: Psychologist, sport and exercise  Tobacco Use   Smoking status: Never   Smokeless tobacco: Never  Vaping Use   Vaping Use: Never used  Substance and Sexual Activity   Alcohol use: Yes    Alcohol/week: 2.0 standard drinks of alcohol    Types: 2 Standard drinks or equivalent per week    Comment: 1 every 4 days and weekends   Drug use: No   Sexual activity: Yes    Partners: Female  Other Topics Concern   Not on file  Social History Narrative   Daily caffeine    Social Determinants of Health   Financial Resource Strain: Not on file  Food Insecurity: Not on file  Transportation Needs: Not on file  Physical Activity: Not on file  Stress: Not on file  Social Connections: Not on file  Intimate Partner Violence: Not on file    Review of Systems: All other review of systems negative except as  mentioned in the HPI.  Physical Exam: Vital signs BP (!) 138/97   Pulse 62   Temp 98 F (36.7 C)   Ht '5\' 8"'$  (1.727 m)   Wt 231 lb (104.8 kg)   SpO2 97%   BMI 35.12 kg/m   General:   Alert,  Well-developed, pleasant and cooperative in NAD Lungs:  Clear throughout to auscultation.   Heart:  Regular rate and rhythm Abdomen:  Soft, nontender and nondistended.   Neuro/Psych:  Alert and cooperative. Normal mood and affect. A and O x 3  Jesse Mango, MD [facility]

## 2021-12-06 NOTE — Patient Instructions (Signed)
Handouts provided about polyps, diverticulosis and hemorrhoids.  Await pathology results.  Referral to colorectal surgery if removal of skin tag is desired.   YOU HAD AN ENDOSCOPIC PROCEDURE TODAY AT [facility]:   Refer to the procedure report that was given to you for any specific questions about what was found during the examination.  If the procedure report does not answer your questions, please call your gastroenterologist to clarify.  If you requested that your care partner not be given the details of your procedure findings, then the procedure report has been included in a sealed envelope for you to review at your convenience later.  YOU SHOULD EXPECT: Some feelings of bloating in the abdomen. Passage of more gas than usual.  Walking can help get rid of the air that was put into your GI tract during the procedure and reduce the bloating. If you had a lower endoscopy (such as a colonoscopy or flexible sigmoidoscopy) you may notice spotting of blood in your stool or on the toilet paper. If you underwent a bowel prep for your procedure, you may not have a normal bowel movement for a few days.  Please Note:  You might notice some irritation and congestion in your nose or some drainage.  This is from the oxygen used during your procedure.  There is no need for concern and it should clear up in a day or so.  SYMPTOMS TO REPORT IMMEDIATELY:  Following lower endoscopy (colonoscopy or flexible sigmoidoscopy):  Excessive amounts of blood in the stool  Significant tenderness or worsening of abdominal pains  Swelling of the abdomen that is new, acute  Fever of 100F or higher   For urgent or emergent issues, a gastroenterologist can be reached at any hour by calling [phone]. Do not use MyChart messaging for urgent concerns.    DIET:  We do recommend a small meal at first, but then you may proceed to your regular diet.  Drink plenty of fluids but you should avoid alcoholic  beverages for 24 hours.  ACTIVITY:  You should plan to take it easy for the rest of today and you should NOT DRIVE or use heavy machinery until tomorrow (because of the sedation medicines used during the test).    FOLLOW UP: Our staff will call the number listed on your records the next business day following your procedure.  We will call around 7:15- 8:00 am to check on you and address any questions or concerns that you may have regarding the information given to you following your procedure. If we do not reach you, we will leave a message.  If you develop any symptoms (ie: fever, flu-like symptoms, shortness of breath, cough etc.) before then, please call [phone].  If you test positive for Covid 19 in the 2 weeks post procedure, please call and report this information to [place].    If any biopsies were taken you will be contacted by phone or by letter within the next 1-3 weeks.  Please call us at [phone] if you have not heard about the biopsies in 3 weeks.    SIGNATURES/CONFIDENTIALITY: You and/or your care partner have signed paperwork which will be entered into your electronic medical record.  These signatures attest to the fact that that the information above on your After Visit Summary has been reviewed and is understood.  Full responsibility of the confidentiality of this discharge information lies with you and/or your care-partner.

## 2021-12-06 NOTE — Progress Notes (Signed)
To pacu, VSS. Report to [email or web address] 

## 2021-12-06 NOTE — Op Note (Signed)
[place] Patient Name: Jesse Steele Procedure Date: 12/06/2021 4:37 PM MRN: [id number] Endoscopist: Remo Lipps P. Havery [name] , MD Age: 51 Referring MD:  Date of Birth: January 25, 1971 Gender: Male Account #: [id number] Procedure:                Colonoscopy Indications:              Screening for colorectal malignant neoplasm, This                            is the patient's first colonoscopy - incidental -                            remote history of diverticulitis, chronic loose                            stools thought to be from IBS Medicines:                Monitored Anesthesia Care Procedure:                Pre-Anesthesia Assessment:                           - Prior to the procedure, a History and Physical                            was performed, and patient medications and                            allergies were reviewed. The patient's tolerance of                            previous anesthesia was also reviewed. The risks                            and benefits of the procedure and the sedation                            options and risks were discussed with the patient.                            All questions were answered, and informed consent                            was obtained. Prior Anticoagulants: The patient has                            taken no previous anticoagulant or antiplatelet                            agents. ASA Grade Assessment: II - A patient with                            mild systemic disease. After reviewing the risks  and benefits, the patient was deemed in                            satisfactory condition to undergo the procedure.                           After obtaining informed consent, the colonoscope                            was passed under direct vision. Throughout the                            procedure, the patient's blood pressure, pulse, and                            oxygen saturations were  monitored continuously. The                            Olympus CF-HQ190L (Serial# [id number]) Colonoscope was                            introduced through the anus and advanced to the the                            cecum, identified by appendiceal orifice and                            ileocecal valve. The colonoscopy was performed                            without difficulty. The patient tolerated the                            procedure well. The quality of the bowel                            preparation was good. The terminal ileum, ileocecal                            valve, appendiceal orifice, and rectum and the                            ileocecal valve, appendiceal orifice, and rectum                            were photographed. Scope In: 4:42:24 PM Scope Out: 5:03:11 PM Scope Withdrawal Time: 0 hours 17 minutes 27 seconds  Total Procedure Duration: 0 hours 20 minutes 47 seconds  Findings:                 A large skin tag was found on perianal exam.                           The terminal ileum appeared normal.  A 2 to 3 mm polyp was found in the cecum. The polyp                            was sessile. The polyp was removed with a cold                            snare. Resection and retrieval were complete.                           Two sessile polyps were found in the ascending                            colon. The polyps were 2 to 3 mm in size. These                            polyps were removed with a cold snare. Resection                            and retrieval were complete.                           A 4 to 5 mm polyp was found in the descending                            colon. The polyp was sessile. The polyp was removed                            with a cold snare. Resection and retrieval were                            complete.                           Many small and large-mouthed diverticula were found                            in the entire  colon, highest burden in the left                            colon.                           Internal hemorrhoids were found during retroflexion.                           The exam was otherwise without abnormality. Prep                            was adequate but significant lavage needed to clear                            the colon of residual stool.  Biopsies for histology were taken with a cold                            forceps from the right colon, left colon and                            transverse colon for evaluation of microscopic                            colitis. Complications:            No immediate complications. Estimated blood loss:                            Minimal. Estimated Blood Loss:     Estimated blood loss was minimal. Impression:               - A large perianal skin tag was found on perianal                            exam.                           - The examined portion of the ileum was normal.                           - One 2 to 3 mm polyp in the cecum, removed with a                            cold snare. Resected and retrieved.                           - Two 2 to 3 mm polyps in the ascending colon,                            removed with a cold snare. Resected and retrieved.                           - One 4 to 5 mm polyp in the descending colon,                            removed with a cold snare. Resected and retrieved.                           - Diverticulosis in the entire examined colon.                           - Internal hemorrhoids.                           - The examination was otherwise normal.                           - Biopsies were taken with a cold forceps from the  right colon, left colon and transverse colon for                            evaluation of microscopic colitis. Recommendation:           - Patient has a contact number available for                             emergencies. The signs and symptoms of potential                            delayed complications were discussed with the                            patient. Return to normal activities tomorrow.                            Written discharge instructions were provided to the                            patient.                           - Resume previous diet.                           - Continue present medications.                           - Await pathology results.                           - Referral to colorectal surgery if removal of skin                            tag is desired Remo Lipps P. Passion Lavin, MD 12/06/2021 5:11:26 PM This report has been signed electronically.

## 2021-12-09 ENCOUNTER — Telehealth: Payer: Self-pay

## 2021-12-09 DIAGNOSIS — F902 Attention-deficit hyperactivity disorder, combined type: Secondary | ICD-10-CM | POA: Diagnosis not present

## 2021-12-09 DIAGNOSIS — Z79899 Other long term (current) drug therapy: Secondary | ICD-10-CM | POA: Diagnosis not present

## 2021-12-09 DIAGNOSIS — F411 Generalized anxiety disorder: Secondary | ICD-10-CM | POA: Diagnosis not present

## 2021-12-09 NOTE — Telephone Encounter (Signed)
  Follow up Call-     12/06/2021    3:16 PM  Call back number  Post procedure Call Back phone  # [phone]  Permission to leave phone message Yes     Patient questions:  Do you have a fever, pain , or abdominal swelling? No. Pain Score  0 *  Have you tolerated food without any problems? Yes.    Have you been able to return to your normal activities? Yes.    Do you have any questions about your discharge instructions: Diet   No. Medications  No. Follow up visit  No.  Do you have questions or concerns about your Care? No.  Actions: * If pain score is 4 or above: No action needed, pain <4.

## 2021-12-09 NOTE — Telephone Encounter (Signed)
Per 12/06/21 procedure report: Referral to colorectal surgery if removal of skin tag is desired.  I called and spoke with patient. He does want to proceed with referral to [facility] at this time. Pt is aware that I will place referral today and [facility] will contact him within the next few weeks directly to set up his appt. Pt verbalized understanding and had no concerns at the end of the call.  Referral, records, demographic, and insurance information faxed to [facility] at [phone].

## 2021-12-12 ENCOUNTER — Encounter: Payer: Self-pay | Admitting: Gastroenterology

## 2021-12-13 NOTE — Telephone Encounter (Signed)
Pt reviewed MyChart message sent by Dr. Havery [name] regarding results.

## 2021-12-16 DIAGNOSIS — E291 Testicular hypofunction: Secondary | ICD-10-CM | POA: Diagnosis not present

## 2021-12-20 DIAGNOSIS — E291 Testicular hypofunction: Secondary | ICD-10-CM | POA: Diagnosis not present

## 2021-12-20 DIAGNOSIS — N528 Other male erectile dysfunction: Secondary | ICD-10-CM | POA: Diagnosis not present

## 2022-01-06 ENCOUNTER — Ambulatory Visit: Payer: Self-pay | Admitting: General Surgery

## 2022-01-06 DIAGNOSIS — K644 Residual hemorrhoidal skin tags: Secondary | ICD-10-CM | POA: Diagnosis not present

## 2022-01-27 ENCOUNTER — Other Ambulatory Visit: Payer: Self-pay | Admitting: General Surgery

## 2022-01-27 DIAGNOSIS — K644 Residual hemorrhoidal skin tags: Secondary | ICD-10-CM | POA: Diagnosis not present

## 2022-03-03 DIAGNOSIS — F411 Generalized anxiety disorder: Secondary | ICD-10-CM | POA: Diagnosis not present

## 2022-03-03 DIAGNOSIS — Z79899 Other long term (current) drug therapy: Secondary | ICD-10-CM | POA: Diagnosis not present

## 2022-03-03 DIAGNOSIS — F902 Attention-deficit hyperactivity disorder, combined type: Secondary | ICD-10-CM | POA: Diagnosis not present

## 2022-03-26 DIAGNOSIS — J309 Allergic rhinitis, unspecified: Secondary | ICD-10-CM | POA: Diagnosis not present

## 2022-03-26 DIAGNOSIS — Z6834 Body mass index (BMI) 34.0-34.9, adult: Secondary | ICD-10-CM | POA: Diagnosis not present

## 2022-03-26 DIAGNOSIS — R030 Elevated blood-pressure reading, without diagnosis of hypertension: Secondary | ICD-10-CM | POA: Diagnosis not present

## 2022-03-26 DIAGNOSIS — Z1152 Encounter for screening for COVID-19: Secondary | ICD-10-CM | POA: Diagnosis not present

## 2022-03-26 DIAGNOSIS — J069 Acute upper respiratory infection, unspecified: Secondary | ICD-10-CM | POA: Diagnosis not present

## 2022-03-29 DIAGNOSIS — J0140 Acute pansinusitis, unspecified: Secondary | ICD-10-CM | POA: Diagnosis not present

## 2022-03-29 DIAGNOSIS — R030 Elevated blood-pressure reading, without diagnosis of hypertension: Secondary | ICD-10-CM | POA: Diagnosis not present

## 2022-03-29 DIAGNOSIS — Z6833 Body mass index (BMI) 33.0-33.9, adult: Secondary | ICD-10-CM | POA: Diagnosis not present

## 2022-04-22 ENCOUNTER — Ambulatory Visit: Payer: BC Managed Care – PPO | Admitting: Internal Medicine

## 2022-04-24 ENCOUNTER — Ambulatory Visit: Payer: BC Managed Care – PPO | Admitting: Internal Medicine

## 2022-04-24 ENCOUNTER — Other Ambulatory Visit: Payer: Self-pay | Admitting: Internal Medicine

## 2022-04-24 ENCOUNTER — Encounter: Payer: Self-pay | Admitting: Internal Medicine

## 2022-04-24 VITALS — BP 127/81 | HR 78 | Temp 98.0°F | Resp 12 | Ht 68.0 in | Wt 240.4 lb

## 2022-04-24 DIAGNOSIS — F900 Attention-deficit hyperactivity disorder, predominantly inattentive type: Secondary | ICD-10-CM

## 2022-04-24 DIAGNOSIS — K769 Liver disease, unspecified: Secondary | ICD-10-CM

## 2022-04-24 DIAGNOSIS — K222 Esophageal obstruction: Secondary | ICD-10-CM | POA: Diagnosis not present

## 2022-04-24 DIAGNOSIS — E669 Obesity, unspecified: Secondary | ICD-10-CM

## 2022-04-24 DIAGNOSIS — F419 Anxiety disorder, unspecified: Secondary | ICD-10-CM

## 2022-04-24 DIAGNOSIS — N401 Enlarged prostate with lower urinary tract symptoms: Secondary | ICD-10-CM

## 2022-04-24 DIAGNOSIS — M76821 Posterior tibial tendinitis, right leg: Secondary | ICD-10-CM

## 2022-04-24 DIAGNOSIS — L989 Disorder of the skin and subcutaneous tissue, unspecified: Secondary | ICD-10-CM

## 2022-04-24 DIAGNOSIS — R7989 Other specified abnormal findings of blood chemistry: Secondary | ICD-10-CM | POA: Diagnosis not present

## 2022-04-24 DIAGNOSIS — Z23 Encounter for immunization: Secondary | ICD-10-CM

## 2022-04-24 DIAGNOSIS — Z87438 Personal history of other diseases of male genital organs: Secondary | ICD-10-CM

## 2022-04-24 DIAGNOSIS — Z0000 Encounter for general adult medical examination without abnormal findings: Secondary | ICD-10-CM

## 2022-04-24 DIAGNOSIS — J3089 Other allergic rhinitis: Secondary | ICD-10-CM

## 2022-04-24 DIAGNOSIS — M76822 Posterior tibial tendinitis, left leg: Secondary | ICD-10-CM

## 2022-04-24 DIAGNOSIS — N529 Male erectile dysfunction, unspecified: Secondary | ICD-10-CM

## 2022-04-24 DIAGNOSIS — R053 Chronic cough: Secondary | ICD-10-CM

## 2022-04-24 DIAGNOSIS — K2100 Gastro-esophageal reflux disease with esophagitis, without bleeding: Secondary | ICD-10-CM

## 2022-04-24 HISTORY — DX: Obesity, unspecified: E66.9

## 2022-04-24 HISTORY — DX: Disorder of the skin and subcutaneous tissue, unspecified: L98.9

## 2022-04-24 LAB — LIPID PANEL
Cholesterol: 225 mg/dL — ABNORMAL HIGH (ref 0–200)
HDL: 55.4 mg/dL (ref >39.00)
NonHDL: 169.89
Total CHOL/HDL Ratio: 4
Triglycerides: 276 mg/dL — ABNORMAL HIGH (ref 0.0–149.0)
VLDL: 55.2 mg/dL — ABNORMAL HIGH (ref 0.0–40.0)

## 2022-04-24 LAB — COMPREHENSIVE METABOLIC PANEL
ALT: 38 U/L (ref 0–53)
AST: 23 U/L (ref 0–37)
Albumin: 4.6 g/dL (ref 3.5–5.2)
Alkaline Phosphatase: 75 U/L (ref 39–117)
BUN: 11 mg/dL (ref 6–23)
CO2: 27 mEq/L (ref 19–32)
Calcium: 9.5 mg/dL (ref 8.4–10.5)
Chloride: 104 mEq/L (ref 96–112)
Creatinine, Ser: 0.8 mg/dL (ref 0.40–1.50)
GFR: 103.25 mL/min (ref >60.00)
Glucose, Bld: 97 mg/dL (ref 70–99)
Potassium: 4.3 mEq/L (ref 3.5–5.1)
Sodium: 138 mEq/L (ref 135–145)
Total Bilirubin: 0.5 mg/dL (ref 0.2–1.2)
Total Protein: 6.7 g/dL (ref 6.0–8.3)

## 2022-04-24 LAB — CBC WITH DIFFERENTIAL/PLATELET
Basophils Absolute: 0.1 10*3/uL (ref 0.0–0.1)
Basophils Relative: 0.9 % (ref 0.0–3.0)
Eosinophils Absolute: 0.3 10*3/uL (ref 0.0–0.7)
Eosinophils Relative: 4.3 % (ref 0.0–5.0)
HCT: 47.4 % (ref 39.0–52.0)
Hemoglobin: 16.1 g/dL (ref 13.0–17.0)
Lymphocytes Relative: 22.3 % (ref 12.0–46.0)
Lymphs Abs: 1.7 10*3/uL (ref 0.7–4.0)
MCHC: 33.9 g/dL (ref 30.0–36.0)
MCV: 87.6 fl (ref 78.0–100.0)
Monocytes Absolute: 0.5 10*3/uL (ref 0.1–1.0)
Monocytes Relative: 6.3 % (ref 3.0–12.0)
Neutro Abs: 4.9 10*3/uL (ref 1.4–7.7)
Neutrophils Relative %: 66.2 % (ref 43.0–77.0)
Platelets: 228 10*3/uL (ref 150.0–400.0)
RBC: 5.41 Mil/uL (ref 4.22–5.81)
RDW: 13.6 % (ref 11.5–15.5)
WBC: 7.4 10*3/uL (ref 4.0–10.5)

## 2022-04-24 LAB — LDL CHOLESTEROL, DIRECT: Direct LDL: 128 mg/dL

## 2022-04-24 MED ORDER — ZEPBOUND 2.5 MG/0.5ML ~~LOC~~ SOAJ: 2.5000 mg | SUBCUTANEOUS | 3 refills | Status: DC

## 2022-04-24 MED ORDER — TADALAFIL 20 MG PO TABS: 20.0000 mg | ORAL_TABLET | Freq: Every day | ORAL | 3 refills | Status: AC | PRN

## 2022-04-24 NOTE — Assessment & Plan Note (Signed)
Advised buspar is safe with testosterone and Vyvanse when he asked me about it. Defer management to Dr. Erling Cruz

## 2022-04-24 NOTE — Progress Notes (Signed)
[place]  Phone: [phone]  New patient visit  Visit Date: 04/24/2022 Patient: Jesse Steele   DOB: 1971/03/12   51 y.o. Male  MRN: [id number]  Today's healthcare provider: Loralee Pacas, MD  Assessment and Plan:   [name] was seen today for establish care, bump on back, discuss medications and annual exam.  Obesity due to energy imbalance Overview: BMI Readings from Last 5 Encounters:  04/24/22 36.55 kg/m  12/06/21 35.12 kg/m  11/08/21 34.42 kg/m  01/22/18 33.11 kg/m  01/19/18 32.90 kg/m     Assessment & Plan: Try to get zepbound. I have had an extensive 5 minute conversation today with the patient about healthy eating habits, exercise, calorie and carb goals for sustainable and successful weight loss. I gave the patient caloric and protein daily intake values as well as described the importance of increasing fiber and water intake. I discussed weight loss medications that could be used in the treatment of this patient. Handouts on low carb eating were given to the patient.    Orders: -     Zepbound; Inject 2.5 mg into the skin once a week.  Dispense: 0.5 mL; Refill: 3  Need for Tdap vaccination -     Tdap vaccine greater than or equal to 7yo IM  Preventative health care -     Testosterone Total,Free,Bio, Males; Future -     CBC with Differential/Platelet; Future -     Comprehensive metabolic panel; Future -     Lipid panel; Future  Stricture and stenosis of esophagus Overview: Dilated by Dr. Donna Christen just once and didn't have problems after for years but has to be careful eating steak   Seasonal allergic rhinitis due to other allergic trigger Overview: He takes Claritin regularly and needs antibiotics about once a year  Assessment & Plan: I explained that he should add simply saline daily or nightly wash to keep the pollen stuff out of his sinuses   Low serum testosterone Overview: Dr. Lovena Neighbours urology has been managing since 2021  ish    Posterior tibial tendon dysfunction (PTTD) of both lower extremities Overview: He get custom arch supports which has helped a lot   Liver lesion Overview: He reports he was found to be benign and long ago the details are not available   H/O testicular mass Overview: Follows with after winter and his father had history of prostate cancer He reports the mass has not been present for as long as he can remember    Gastroesophageal reflux disease with esophagitis without hemorrhage Overview: He does not take anything for this but endorses that he does have intermittent problems with it and its associated with a narrow esophagus  Assessment & Plan: I encouraged him to take Pepcid Complete anytime he is experiencing heartburn and not ignore it but I defer to his GI specialist for ideal management   Anxiety disorder, unspecified type Overview: On buspar for anxiety and he also gets his ADHD managed by Dr. Erling Cruz   Assessment & Plan: Advised buspar is safe with testosterone and Vyvanse when he asked me about it. Defer management to Dr. Erling Cruz   Erectile dysfunction, unspecified erectile dysfunction type Overview: Also going continue testosterone gel on sildenafil per Urology  Orders: -     Tadalafil; Take 1 tablet (20 mg total) by mouth daily as needed for erectile dysfunction.  Dispense: 90 tablet; Refill: 3  Chronic cough Overview: Comes and goes for many years is related to allergies  Benign prostatic hyperplasia with lower urinary tract symptoms, symptom details unspecified Overview: per Urology Dr. Lovena Neighbours No results found for: "PSA1", "PSA" but DrMarland Kitchen Gilford Rile checks it regularly and its normal     Skin lesion Overview: Itchy raised spot for 1 week improved with neosporin- appears like a bug bite or maybe an abscess quite itchy  Assessment & Plan: I reassured due to small size and abscess like appearance that it is likely a bug bite or abscess Advised warm  compresses Since shrinking I'm even less worried, but I did encourage him to return for preventive visit soon so I can look at or come back sooner if it worsens/doesn't resolve.   Other orders -     LDL cholesterol, direct    Due to complex problem list needed to be developed and updated, and decision to treat erectile dysfunction and obesity, we did not complete the annual preventive exam today, but I encouraged him to return for that at his soonest convenience.  Subjective:  Patient presents today to establish care.  Chief Complaint  Patient presents with   [place] on back    Red and slightly itchy for about a week. Uses neosporin with some relief (pain and redness).   Discuss medications    Wants to know if it's okay to take Buspirone with Vyvanse and the testosterone gel.   Annual Exam    Will schedule a lab visit for blood work at a later time. Labs already ordered for future.    Problem-oriented charting was used to update the medical history: Problem  Obesity Due to Energy Imbalance   BMI Readings from Last 5 Encounters:  04/24/22 36.55 kg/m  12/06/21 35.12 kg/m  11/08/21 34.42 kg/m  01/22/18 33.11 kg/m  01/19/18 32.90 kg/m      Skin Lesion   Itchy raised spot for 1 week improved with neosporin- appears like a bug bite or maybe an abscess quite itchy   Posterior Tibial Tendon Dysfunction (Pttd) of Both Lower Extremities   He get custom arch supports which has helped a lot   Anxiety Disorder   On buspar for anxiety and he also gets his ADHD managed by Dr. Erling Cruz    Allergic Rhinosinusitis   He takes Claritin regularly and needs antibiotics about once a year   Cough   Comes and goes for many years is related to allergies   Ed (Erectile Dysfunction)   Also going continue testosterone gel on sildenafil per Urology   Bph (Benign Prostatic Hyperplasia)   per Urology Dr. Lovena Neighbours No results found for: "PSA1", "PSA" but DrMarland Kitchen Gilford Rile checks it  regularly and its normal     H/O Testicular Mass   Follows with after winter and his father had history of prostate cancer He reports the mass has not been present for as long as he can remember    Low Serum Testosterone   Dr. Lovena Neighbours urology has been managing since 2021 ish    Gerd (Gastroesophageal Reflux Disease)   He does not take anything for this but endorses that he does have intermittent problems with it and its associated with a narrow esophagus   Liver Lesion   He reports he was found to be benign and long ago the details are not available   Stricture and Stenosis of Esophagus   Dilated by Dr. Donna Christen just once and didn't have problems after for years but has to be careful eating steak   Pain in Right Foot (Resolved)  Acute Non-Recurrent Maxillary Sinusitis (Resolved)  Right Otitis Media (Resolved)  Other Dysphagia (Resolved)     Depression Screen    04/24/2022   10:09 AM 01/22/2018   12:30 PM 01/19/2018    3:57 PM 10/13/2017    9:55 AM  PHQ 2/9 Scores  PHQ - 2 Score 0 0 0 0   Results for orders placed or performed in visit on 04/24/22  Lipid panel  Result Value Ref Range   Cholesterol 225 (H) 0 - 200 mg/dL   Triglycerides 276.0 (H) 0.0 - 149.0 mg/dL   HDL 55.40 >39.00 mg/dL   VLDL 55.2 (H) 0.0 - 40.0 mg/dL   Total CHOL/HDL Ratio 4    NonHDL 169.89   Comp Met (CMET)  Result Value Ref Range   Sodium 138 135 - 145 mEq/L   Potassium 4.3 3.5 - 5.1 mEq/L   Chloride 104 96 - 112 mEq/L   CO2 27 19 - 32 mEq/L   Glucose, Bld 97 70 - 99 mg/dL   BUN 11 6 - 23 mg/dL   Creatinine, Ser 0.80 0.40 - 1.50 mg/dL   Total Bilirubin 0.5 0.2 - 1.2 mg/dL   Alkaline Phosphatase 75 39 - 117 U/L   AST 23 0 - 37 U/L   ALT 38 0 - 53 U/L   Total Protein 6.7 6.0 - 8.3 g/dL   Albumin 4.6 3.5 - 5.2 g/dL   GFR 103.25 >60.00 mL/min   Calcium 9.5 8.4 - 10.5 mg/dL  CBC w/Diff  Result Value Ref Range   WBC 7.4 4.0 - 10.5 K/uL   RBC 5.41 4.22 - 5.81 Mil/uL   Hemoglobin 16.1 13.0 -  17.0 g/dL   HCT 47.4 39.0 - 52.0 %   MCV 87.6 78.0 - 100.0 fl   MCHC 33.9 30.0 - 36.0 g/dL   RDW 13.6 11.5 - 15.5 %   Platelets 228.0 150.0 - 400.0 K/uL   Neutrophils Relative % 66.2 43.0 - 77.0 %   Lymphocytes Relative 22.3 12.0 - 46.0 %   Monocytes Relative 6.3 3.0 - 12.0 %   Eosinophils Relative 4.3 0.0 - 5.0 %   Basophils Relative 0.9 0.0 - 3.0 %   Neutro Abs 4.9 1.4 - 7.7 K/uL   Lymphs Abs 1.7 0.7 - 4.0 K/uL   Monocytes Absolute 0.5 0.1 - 1.0 K/uL   Eosinophils Absolute 0.3 0.0 - 0.7 K/uL   Basophils Absolute 0.1 0.0 - 0.1 K/uL  LDL cholesterol, direct  Result Value Ref Range   Direct LDL 128.0 mg/dL     The following were reviewed and entered/updated in epic: Past Medical History:  Diagnosis Date   ADHD (attention deficit hyperactivity disorder)    Anxiety    Diverticulitis    Diverticulosis    Duodenitis    Esophageal stricture    GERD (gastroesophageal reflux disease)    Hiatal hernia    IBS (irritable bowel syndrome)    Liver lesion    Low testosterone    Lumbar degenerative disc disease    Obesity due to energy imbalance 04/24/2022   BMI Readings from Last 5 Encounters: 04/24/22 36.55 kg/m 12/06/21 35.12 kg/m 11/08/21 34.42 kg/m 01/22/18 33.11 kg/m 01/19/18 32.90 kg/m     Skin lesion 04/24/2022   Itchy raised spot for 1 week improved with neosporin- appears like a bug bite or maybe an abscess quite itchy   Past Surgical History:  Procedure Laterality Date   ADENOIDECTOMY     ESOPHAGOGASTRODUODENOSCOPY  11/03/2011   Procedure: ESOPHAGOGASTRODUODENOSCOPY (  EGD);  Surgeon: Lafayette Dragon, MD;  Location: [facility];  Service: Endoscopy;  Laterality: N/A;   HERNIA REPAIR     SAVORY DILATION  11/03/2011   Procedure: SAVORY DILATION;  Surgeon: Lafayette Dragon, MD;  Location: [facility];  Service: Endoscopy;  Laterality: N/A;   TONSILLECTOMY     UMBILICAL HERNIA REPAIR     UPPER GASTROINTESTINAL ENDOSCOPY     Family History  Problem Relation Age of  Onset   COPD Mother    Heart disease Mother    Hypertension Mother    Heart failure Mother    Depression Mother    Prostate cancer Father    Hypertension Father    COPD Father    Pneumonia Father    Heart failure Father    Gallstones Father    ADD / ADHD Father    Hypertension Sister    Gallstones Sister    Heart disease Maternal Grandmother    Kidney disease Paternal Grandmother    Hypertension Sister    Colon cancer Neg Hx    Outpatient Medications Prior to Visit  Medication Sig Dispense Refill   azelastine (ASTELIN) 0.1 % nasal spray SPRAY 1 SPRAY INTO EACH NOSTRIL TWICE A DAY AS NEEDED     Lisdexamfetamine Dimesylate 60 MG CHEW Chew by mouth.     Testosterone 20.25 MG/ACT (1.62%) GEL SMARTSIG:1 Pump Topical Daily     lisdexamfetamine (VYVANSE) 50 MG capsule Take by mouth.     busPIRone (BUSPAR) 10 MG tablet Take 10 mg by mouth 2 (two) times daily.     No facility-administered medications prior to visit.    Allergies  Allergen Reactions   Codeine Other (See Comments)    Reaction unknown (family allergy)   Penicillins Nausea Only   Sulfa Antibiotics Nausea Only   Social History   Tobacco Use   Smoking status: Never   Smokeless tobacco: Never  Vaping Use   Vaping Use: Never used  Substance Use Topics   Alcohol use: Yes    Alcohol/week: 4.0 standard drinks of alcohol    Types: 2 Cans of beer, 2 Standard drinks or equivalent per week    Comment: 1 every 4 days and weekends   Drug use: No    Immunization History  Administered Date(s) Administered   Influenza, Seasonal, Injecte, Preservative Fre 05/09/2012   Influenza,inj,Quad PF,6+ Mos 04/25/2014   PFIZER(Purple Top)SARS-COV-2 Vaccination 07/17/2019, 08/16/2019, 03/24/2020   Tdap 04/24/2022    Objective:  BP 127/81 (BP Location: Left Arm, Patient Position: Sitting)   Pulse 78   Temp 98 F (36.7 C) (Temporal)   Resp 12   Ht _0  (1.727 m)   Wt 240 lb 6.4 oz (109 kg)   SpO2 95%   BMI 36.55 kg/m  Body  mass index is 36.55 kg/m.   Physical Exam  Vital signs reviewed.  Nursing notes reviewed. General Appearance/Constitutional:  Obese male in no acute distress.  He is authentic, earnest, and polite. HEENT:  normocephalic, atraumatic, conjunctiva clear Pulmonary:   normal work of breathing, no respiratory distress Cardiovascular:  Normal heart rate. Normal rhythm. No murmurs, rubs, or gallops.   Musculoskeletal: All extremities are intact.   Neurological:  Awake, alert,  No obvious focal neurological deficits or cognitive impairments Psychiatric:  Appropriate mood, pleasant demeanor Problem-specific findings:    Results Reviewed: Results for orders placed or performed in visit on 04/24/22  Lipid panel  Result Value Ref Range   Cholesterol 225 (H) 0 - 200 mg/dL  Triglycerides 276.0 (H) 0.0 - 149.0 mg/dL   HDL 55.40 >39.00 mg/dL   VLDL 55.2 (H) 0.0 - 40.0 mg/dL   Total CHOL/HDL Ratio 4    NonHDL 169.89   Comp Met (CMET)  Result Value Ref Range   Sodium 138 135 - 145 mEq/L   Potassium 4.3 3.5 - 5.1 mEq/L   Chloride 104 96 - 112 mEq/L   CO2 27 19 - 32 mEq/L   Glucose, Bld 97 70 - 99 mg/dL   BUN 11 6 - 23 mg/dL   Creatinine, Ser 0.80 0.40 - 1.50 mg/dL   Total Bilirubin 0.5 0.2 - 1.2 mg/dL   Alkaline Phosphatase 75 39 - 117 U/L   AST 23 0 - 37 U/L   ALT 38 0 - 53 U/L   Total Protein 6.7 6.0 - 8.3 g/dL   Albumin 4.6 3.5 - 5.2 g/dL   GFR 103.25 >60.00 mL/min   Calcium 9.5 8.4 - 10.5 mg/dL  CBC w/Diff  Result Value Ref Range   WBC 7.4 4.0 - 10.5 K/uL   RBC 5.41 4.22 - 5.81 Mil/uL   Hemoglobin 16.1 13.0 - 17.0 g/dL   HCT 47.4 39.0 - 52.0 %   MCV 87.6 78.0 - 100.0 fl   MCHC 33.9 30.0 - 36.0 g/dL   RDW 13.6 11.5 - 15.5 %   Platelets 228.0 150.0 - 400.0 K/uL   Neutrophils Relative % 66.2 43.0 - 77.0 %   Lymphocytes Relative 22.3 12.0 - 46.0 %   Monocytes Relative 6.3 3.0 - 12.0 %   Eosinophils Relative 4.3 0.0 - 5.0 %   Basophils Relative 0.9 0.0 - 3.0 %   Neutro Abs 4.9 1.4  - 7.7 K/uL   Lymphs Abs 1.7 0.7 - 4.0 K/uL   Monocytes Absolute 0.5 0.1 - 1.0 K/uL   Eosinophils Absolute 0.3 0.0 - 0.7 K/uL   Basophils Absolute 0.1 0.0 - 0.1 K/uL  LDL cholesterol, direct  Result Value Ref Range   Direct LDL 128.0 mg/dL

## 2022-04-24 NOTE — Assessment & Plan Note (Signed)
I encouraged him to take Pepcid Complete anytime he is experiencing heartburn and not ignore it but I defer to his GI specialist for ideal management

## 2022-04-24 NOTE — Assessment & Plan Note (Signed)
I reassured due to small size and abscess like appearance that it is likely a bug bite or abscess Advised warm compresses Since shrinking I'm even less worried, but I did encourage him to return for preventive visit soon so I can look at or come back sooner if it worsens/doesn't resolve.

## 2022-04-24 NOTE — Assessment & Plan Note (Signed)
I explained that he should add simply saline daily or nightly wash to keep the pollen stuff out of his sinuses

## 2022-04-24 NOTE — Assessment & Plan Note (Signed)
Try to get zepbound. I have had an extensive 5 minute conversation today with the patient about healthy eating habits, exercise, calorie and carb goals for sustainable and successful weight loss. I gave the patient caloric and protein daily intake values as well as described the importance of increasing fiber and water intake. I discussed weight loss medications that could be used in the treatment of this patient. Handouts on low carb eating were given to the patient.

## 2022-04-25 LAB — TESTOSTERONE TOTAL,FREE,BIO, MALES
Albumin: 4.4 g/dL (ref 3.6–5.1)
Sex Hormone Binding: 27 nmol/L (ref 10–50)
Testosterone, Bioavailable: 233 ng/dL (ref 110.0–575.0)
Testosterone, Free: 115.7 pg/mL (ref 46.0–224.0)
Testosterone: 679 ng/dL (ref 250–827)

## 2022-04-29 ENCOUNTER — Other Ambulatory Visit: Payer: Self-pay | Admitting: Internal Medicine

## 2022-04-29 DIAGNOSIS — E669 Obesity, unspecified: Secondary | ICD-10-CM

## 2022-04-30 ENCOUNTER — Other Ambulatory Visit: Payer: Self-pay | Admitting: Internal Medicine

## 2022-04-30 DIAGNOSIS — E669 Obesity, unspecified: Secondary | ICD-10-CM

## 2022-05-01 ENCOUNTER — Encounter: Payer: Self-pay | Admitting: Internal Medicine

## 2022-05-01 ENCOUNTER — Ambulatory Visit: Payer: BC Managed Care – PPO | Admitting: Internal Medicine

## 2022-05-01 ENCOUNTER — Other Ambulatory Visit: Payer: Self-pay | Admitting: Internal Medicine

## 2022-05-01 VITALS — BP 121/74 | HR 83 | Temp 97.2°F | Resp 12 | Ht 68.0 in | Wt 239.0 lb

## 2022-05-01 DIAGNOSIS — E781 Pure hyperglyceridemia: Secondary | ICD-10-CM

## 2022-05-01 DIAGNOSIS — K2100 Gastro-esophageal reflux disease with esophagitis, without bleeding: Secondary | ICD-10-CM | POA: Diagnosis not present

## 2022-05-01 DIAGNOSIS — E785 Hyperlipidemia, unspecified: Secondary | ICD-10-CM | POA: Diagnosis not present

## 2022-05-01 DIAGNOSIS — E669 Obesity, unspecified: Secondary | ICD-10-CM | POA: Diagnosis not present

## 2022-05-01 HISTORY — DX: Hyperlipidemia, unspecified: E78.5

## 2022-05-01 MED ORDER — SEMAGLUTIDE-WEIGHT MANAGEMENT 0.5 MG/0.5ML ~~LOC~~ SOAJ: 0.5000 mg | SUBCUTANEOUS | 0 refills | Status: DC

## 2022-05-01 MED ORDER — SEMAGLUTIDE-WEIGHT MANAGEMENT 0.25 MG/0.5ML ~~LOC~~ SOAJ: 0.2500 mg | SUBCUTANEOUS | 0 refills | Status: AC

## 2022-05-01 MED ORDER — SEMAGLUTIDE-WEIGHT MANAGEMENT 1 MG/0.5ML ~~LOC~~ SOAJ: 1.0000 mg | SUBCUTANEOUS | 0 refills | Status: AC

## 2022-05-01 MED ORDER — SEMAGLUTIDE-WEIGHT MANAGEMENT 1.7 MG/0.75ML ~~LOC~~ SOAJ: 1.7000 mg | SUBCUTANEOUS | 0 refills | Status: AC

## 2022-05-01 MED ORDER — SEMAGLUTIDE-WEIGHT MANAGEMENT 2.4 MG/0.75ML ~~LOC~~ SOAJ: 2.4000 mg | SUBCUTANEOUS | 0 refills | Status: AC

## 2022-05-01 MED ORDER — ZEPBOUND 2.5 MG/0.5ML ~~LOC~~ SOAJ: 2.5000 mg | SUBCUTANEOUS | 3 refills | Status: DC

## 2022-05-01 NOTE — Progress Notes (Signed)
[facility]: [phone]   Routine Medical Office Visit  Patient:  Jesse Steele      Age: 51 y.o.       Sex:  male  Date:   05/01/2022  PCP:    Loralee Pacas, MD   [place] Provider: Loralee Pacas, MD  Assessment/Plan:   Jesse Steele was seen today for one week follow-up.  Obesity due to energy imbalance Overview: BMI Readings from Last 5 Encounters:  04/24/22 36.55 kg/m  12/06/21 35.12 kg/m  11/08/21 34.42 kg/m  01/22/18 33.11 kg/m  01/19/18 32.90 kg/m     Assessment & Plan: I have had an extensive 5 minute conversation today with the patient about healthy eating habits, exercise, calorie and carb goals for sustainable and successful weight loss. I gave the patient caloric and protein daily intake values as well as described the importance of increasing fiber and water intake. I discussed weight loss medications that could be used in the treatment of this patient. Handouts on low carb eating were given to the patient- carb counting and how to stay under 20 grams carbohydrates.  He is doing great with taking fiber Gummies and lifting weights so he just needs to stay on top of the fluid and try to keep his carbs down and he should see improvement even if he is unable to get the medications that I sent 10 but we are following every pathway possible by sending in both [facility] and that bound to try to get the prior authorization to go through and for the availability to go up at some point soon  Orders: -     Semaglutide-Weight Management; Inject 0.25 mg into the skin once a week for 28 days.  Dispense: 2 mL; Refill: 0 -     Semaglutide-Weight Management; Inject 0.5 mg into the skin once a week for 28 days.  Dispense: 2 mL; Refill: 0 -     Semaglutide-Weight Management; Inject 1 mg into the skin once a week for 28 days.  Dispense: 2 mL; Refill: 0 -     Semaglutide-Weight Management; Inject 1.7 mg into the skin once a week for 28 days.  Dispense: 3  mL; Refill: 0 -     Semaglutide-Weight Management; Inject 2.4 mg into the skin once a week for 28 days.  Dispense: 3 mL; Refill: 0 -     Zepbound; Inject 2.5 mg into the skin once a week.  Dispense: 0.5 mL; Refill: 3  Hyperlipidemia, acquired Overview: The 10-year ASCVD risk score (Arnett DK, et al., 2019) is: 3.3%   Values used to calculate the score:     Age: 13 years     Sex: Male     Is Non-Hispanic African American: No     Diabetic: No     Tobacco smoker: No     Systolic Blood Pressure: 834 mmHg     Is BP treated: No     HDL Cholesterol: 55.4 mg/dL     Total Cholesterol: 225 mg/dL   Assessment & Plan: We went over his cholesterol profile in detail today explaining how HDL is good LDL is bad and how eggs in moderation are fine especially since they may raise his HDL in in accordance with the LDL more so and improve his ratio.  The ratio is what matters and affecting that the key will be weight loss and he is doing everything he can to lose weight try to get Wegovy and Mounjaro today.  I  did also counsel on encouraged him to continue to exercise he is doing but with regards to the cholesterol diet I advised him that the most important things to have in the diet are extra virgin olive oil avocados nuts and seafood.  We also discussed how there is controversy regarding eggs and their impact on cholesterol when reviewing the medical literature and we discussed how a low-carb diet has been controversial with regards to its impact on cholesterol profiles but I have personally witnessed many many people losing weight on low-carb diets with high cholesterol intake managed to lower their cholesterol profile risk.   Gastroesophageal reflux disease with esophagitis without hemorrhage Overview: does have intermittent problems with it and its associated with a narrow esophagus As needed pepcid controls  Assessment & Plan: Only advised using Pepcid Complete for any experience of dips dyspepsia  warned him about the risk of ulcers and esophageal cancer with untreated heartburn so it is also important to watch the foods and avoid the spicy Comorbid esophageal stricture with history esophageal dilation x2      Subjective:   Jesse Steele is a 51 y.o. male with the following chart data reviewed: Past Medical History:  Diagnosis Date   ADHD (attention deficit hyperactivity disorder)    Anxiety    Diverticulitis    Diverticulosis    Duodenitis    Esophageal stricture    GERD (gastroesophageal reflux disease)    Hiatal hernia    Hyperlipidemia, acquired 05/01/2022   The 10-year ASCVD risk score (Arnett DK, et al., 2019) is: 3.3%   Values used to calculate the score:     Age: 69 years     Sex: Male     Is Non-Hispanic African American: No     Diabetic: No     Tobacco smoker: No     Systolic Blood Pressure: 001 mmHg     Is BP treated: No     HDL Cholesterol: 55.4 mg/dL     Total Cholesterol: 225 mg/dL    IBS (irritable bowel syndrome)    Liver lesion    Low testosterone    Lumbar degenerative disc disease    Obesity due to energy imbalance 04/24/2022   BMI Readings from Last 5 Encounters: 04/24/22 36.55 kg/m 12/06/21 35.12 kg/m 11/08/21 34.42 kg/m 01/22/18 33.11 kg/m 01/19/18 32.90 kg/m     Skin lesion 04/24/2022   Itchy raised spot for 1 week improved with neosporin- appears like a bug bite or maybe an abscess quite itchy   Outpatient Medications Prior to Visit  Medication Sig   azelastine (ASTELIN) 0.1 % nasal spray SPRAY 1 SPRAY INTO EACH NOSTRIL TWICE A DAY AS NEEDED   busPIRone (BUSPAR) 10 MG tablet Take 10 mg by mouth 2 (two) times daily.   Lisdexamfetamine Dimesylate 60 MG CHEW Chew by mouth.   tadalafil (CIALIS) 20 MG tablet Take 1 tablet (20 mg total) by mouth daily as needed for erectile dysfunction.   Testosterone 20.25 MG/ACT (1.62%) GEL SMARTSIG:1 Pump Topical Daily   [DISCONTINUED] Tirzepatide-Weight Management (ZEPBOUND) 2.5 MG/0.5ML SOAJ  Inject 2.5 mg into the skin once a week.   No facility-administered medications prior to visit.     He presented today reporting reason for visit as: Chief Complaint  Patient presents with   One week follow-up     Problem-focused charting was used to record today's medical interview as problems and problem overview medical record updates as follows: Problem  Hyperlipidemia, Acquired   The 10-year ASCVD risk  score (Arnett DK, et al., 2019) is: 3.3%   Values used to calculate the score:     Age: 65 years     Sex: Male     Is Non-Hispanic African American: No     Diabetic: No     Tobacco smoker: No     Systolic Blood Pressure: 509 mmHg     Is BP treated: No     HDL Cholesterol: 55.4 mg/dL     Total Cholesterol: 225 mg/dL    Obesity Due to Energy Imbalance   BMI Readings from Last 5 Encounters:  04/24/22 36.55 kg/m  12/06/21 35.12 kg/m  11/08/21 34.42 kg/m  01/22/18 33.11 kg/m  01/19/18 32.90 kg/m      Gerd (Gastroesophageal Reflux Disease)   does have intermittent problems with it and its associated with a narrow esophagus As needed pepcid controls             Objective:  Physical Exam  BP 121/74 (BP Location: Left Arm, Patient Position: Sitting)   Pulse 83   Temp (!) 97.2 F (36.2 C) (Temporal)   Resp 12   Ht _0  (1.727 m)   Wt 239 lb (108.4 kg)   SpO2 95%   BMI 36.34 kg/m  Vital signs reviewed.  Nursing notes reviewed. General Appearance/Constitutional:  Obese male in no acute distress Musculoskeletal: All extremities are intact.  Neurological:  Awake, alert,  No obvious focal neurological deficits or cognitive impairments Psychiatric:  Appropriate mood, pleasant demeanor Problem-specific findings:  motivated for weight loss, cholesterol review.   Results Reviewed:  No results found for any visits on 05/01/22.   Recent Results (from the past 2160 hour(s))  Lipid panel     Status: Abnormal   Collection Time: 04/24/22 11:32 AM  Result Value  Ref Range   Cholesterol 225 (H) 0 - 200 mg/dL    Comment: ATP III Classification       Desirable:  < 200 mg/dL               Borderline High:  200 - 239 mg/dL          High:  > = 240 mg/dL   Triglycerides 276.0 (H) 0.0 - 149.0 mg/dL    Comment: Normal:  <150 mg/dLBorderline High:  150 - 199 mg/dL   HDL 55.40 >39.00 mg/dL   VLDL 55.2 (H) 0.0 - 40.0 mg/dL   Total CHOL/HDL Ratio 4     Comment:                Men          Women1/2 Average Risk     3.4          3.3Average Risk          5.0          4.42X Average Risk          9.6          7.13X Average Risk          15.0          11.0                       NonHDL 169.89     Comment: NOTE:  Non-HDL goal should be 30 mg/dL higher than patient's LDL goal (i.e. LDL goal of < 70 mg/dL, would have non-HDL goal of < 100 mg/dL)  Comp Met (CMET)     Status: None   Collection Time:  04/24/22 11:32 AM  Result Value Ref Range   Sodium 138 135 - 145 mEq/L   Potassium 4.3 3.5 - 5.1 mEq/L   Chloride 104 96 - 112 mEq/L   CO2 27 19 - 32 mEq/L   Glucose, Bld 97 70 - 99 mg/dL   BUN 11 6 - 23 mg/dL   Creatinine, Ser 0.80 0.40 - 1.50 mg/dL   Total Bilirubin 0.5 0.2 - 1.2 mg/dL   Alkaline Phosphatase 75 39 - 117 U/L   AST 23 0 - 37 U/L   ALT 38 0 - 53 U/L   Total Protein 6.7 6.0 - 8.3 g/dL   Albumin 4.6 3.5 - 5.2 g/dL   GFR 103.25 >60.00 mL/min    Comment: Calculated using the CKD-EPI Creatinine Equation (2021)   Calcium 9.5 8.4 - 10.5 mg/dL  CBC w/Diff     Status: None   Collection Time: 04/24/22 11:32 AM  Result Value Ref Range   WBC 7.4 4.0 - 10.5 K/uL   RBC 5.41 4.22 - 5.81 Mil/uL   Hemoglobin 16.1 13.0 - 17.0 g/dL   HCT 47.4 39.0 - 52.0 %   MCV 87.6 78.0 - 100.0 fl   MCHC 33.9 30.0 - 36.0 g/dL   RDW 13.6 11.5 - 15.5 %   Platelets 228.0 150.0 - 400.0 K/uL   Neutrophils Relative % 66.2 43.0 - 77.0 %   Lymphocytes Relative 22.3 12.0 - 46.0 %   Monocytes Relative 6.3 3.0 - 12.0 %   Eosinophils Relative 4.3 0.0 - 5.0 %   Basophils Relative 0.9 0.0 -  3.0 %   Neutro Abs 4.9 1.4 - 7.7 K/uL   Lymphs Abs 1.7 0.7 - 4.0 K/uL   Monocytes Absolute 0.5 0.1 - 1.0 K/uL   Eosinophils Absolute 0.3 0.0 - 0.7 K/uL   Basophils Absolute 0.1 0.0 - 0.1 K/uL  Testosterone Total,Free,Bio, Males-([facility])     Status: None   Collection Time: 04/24/22 11:32 AM  Result Value Ref Range   Testosterone 679 250 - 827 ng/dL   Albumin 4.4 3.6 - 5.1 g/dL   Sex Hormone Binding 27 10 - 50 nmol/L   Testosterone, Free 115.7 46.0 - 224.0 pg/mL   Testosterone, Bioavailable 233.0 110.0 - 575.0 ng/dL  LDL cholesterol, direct     Status: None   Collection Time: 04/24/22 11:32 AM  Result Value Ref Range   Direct LDL 128.0 mg/dL    Comment: Optimal:  <100 mg/dLNear or Above Optimal:  100-129 mg/dLBorderline High:  130-159 mg/dLHigh:  160-189 mg/dLVery High:  >190 mg/dL      Loralee Pacas, MD

## 2022-05-01 NOTE — Assessment & Plan Note (Addendum)
Only advised using Pepcid Complete for any experience of dips dyspepsia warned him about the risk of ulcers and esophageal cancer with untreated heartburn so it is also important to watch the foods and avoid the spicy Comorbid esophageal stricture with history esophageal dilation x2

## 2022-05-01 NOTE — Assessment & Plan Note (Signed)
I have had an extensive 5 minute conversation today with the patient about healthy eating habits, exercise, calorie and carb goals for sustainable and successful weight loss. I gave the patient caloric and protein daily intake values as well as described the importance of increasing fiber and water intake. I discussed weight loss medications that could be used in the treatment of this patient. Handouts on low carb eating were given to the patient- carb counting and how to stay under 20 grams carbohydrates.  He is doing great with taking fiber Gummies and lifting weights so he just needs to stay on top of the fluid and try to keep his carbs down and he should see improvement even if he is unable to get the medications that I sent 10 but we are following every pathway possible by sending in both [facility] and that bound to try to get the prior authorization to go through and for the availability to go up at some point soon

## 2022-05-01 NOTE — Assessment & Plan Note (Signed)
We went over his cholesterol profile in detail today explaining how HDL is good LDL is bad and how eggs in moderation are fine especially since they may raise his HDL in in accordance with the LDL more so and improve his ratio.  The ratio is what matters and affecting that the key will be weight loss and he is doing everything he can to lose weight try to get Wegovy and Mounjaro today.  I did also counsel on encouraged him to continue to exercise he is doing but with regards to the cholesterol diet I advised him that the most important things to have in the diet are extra virgin olive oil avocados nuts and seafood.  We also discussed how there is controversy regarding eggs and their impact on cholesterol when reviewing the medical literature and we discussed how a low-carb diet has been controversial with regards to its impact on cholesterol profiles but I have personally witnessed many many people losing weight on low-carb diets with high cholesterol intake managed to lower their cholesterol profile risk.

## 2022-05-07 ENCOUNTER — Telehealth: Payer: Self-pay

## 2022-05-07 ENCOUNTER — Other Ambulatory Visit (HOSPITAL_COMMUNITY): Payer: Self-pay

## 2022-05-07 NOTE — Telephone Encounter (Signed)
Pharmacy Patient Advocate Encounter  Received notification from [place] that the request for prior authorization for [facility] has been denied due to  .    How would you like to proceed?  Please be advised appeals may take up to 5 business days to be submitted as pharmacist prepares necessary documentation.  Thank you!

## 2022-05-07 NOTE — Telephone Encounter (Signed)
Pharmacy Patient Advocate Encounter   Received notification from [facility] that prior authorization for [facility] 1.'7mg'$ /0.25m is required/requested.     PA submitted on 12.27.23 to (ins) [employer]  via CoverMyMeds Key [id number] is pending

## 2022-05-15 NOTE — Telephone Encounter (Signed)
Patient called with denial notification. Patient would like a call back.

## 2022-05-30 DIAGNOSIS — Z79899 Other long term (current) drug therapy: Secondary | ICD-10-CM | POA: Diagnosis not present

## 2022-05-30 DIAGNOSIS — F411 Generalized anxiety disorder: Secondary | ICD-10-CM | POA: Diagnosis not present

## 2022-05-30 DIAGNOSIS — F902 Attention-deficit hyperactivity disorder, combined type: Secondary | ICD-10-CM | POA: Diagnosis not present

## 2022-05-30 NOTE — Telephone Encounter (Signed)
Spoke with patient concerning this. 

## 2022-06-03 ENCOUNTER — Telehealth (INDEPENDENT_AMBULATORY_CARE_PROVIDER_SITE_OTHER): Payer: BC Managed Care – PPO | Admitting: Internal Medicine

## 2022-06-03 ENCOUNTER — Encounter: Payer: Self-pay | Admitting: Internal Medicine

## 2022-06-03 VITALS — BP 128/78

## 2022-06-03 DIAGNOSIS — J328 Other chronic sinusitis: Secondary | ICD-10-CM | POA: Diagnosis not present

## 2022-06-03 DIAGNOSIS — E669 Obesity, unspecified: Secondary | ICD-10-CM | POA: Diagnosis not present

## 2022-06-03 MED ORDER — SEMAGLUTIDE-WEIGHT MANAGEMENT 1 MG/0.5ML ~~LOC~~ SOAJ: 1.0000 mg | SUBCUTANEOUS | 0 refills | Status: AC

## 2022-06-03 MED ORDER — SEMAGLUTIDE-WEIGHT MANAGEMENT 1.7 MG/0.75ML ~~LOC~~ SOAJ: 1.7000 mg | SUBCUTANEOUS | 0 refills | Status: DC

## 2022-06-03 MED ORDER — SEMAGLUTIDE-WEIGHT MANAGEMENT 0.5 MG/0.5ML ~~LOC~~ SOAJ: 0.5000 mg | SUBCUTANEOUS | 0 refills | Status: AC

## 2022-06-03 MED ORDER — AMOXICILLIN 400 MG/5ML PO SUSR: 800.0000 mg | Freq: Two times a day (BID) | ORAL | 0 refills | Status: AC

## 2022-06-03 MED ORDER — SEMAGLUTIDE-WEIGHT MANAGEMENT 0.25 MG/0.5ML ~~LOC~~ SOAJ: 0.2500 mg | SUBCUTANEOUS | 0 refills | Status: AC

## 2022-06-03 MED ORDER — SEMAGLUTIDE-WEIGHT MANAGEMENT 2.4 MG/0.75ML ~~LOC~~ SOAJ: 2.4000 mg | SUBCUTANEOUS | 0 refills | Status: DC

## 2022-06-03 NOTE — Assessment & Plan Note (Signed)
Encouraged continuing with diet and exercise Agreed to try again to get [facility] through prior authorization

## 2022-06-03 NOTE — Addendum Note (Signed)
Addended by: Loralee Pacas on: 06/03/2022 10:53 AM   Modules accepted: Level of Service

## 2022-06-03 NOTE — Progress Notes (Signed)
[facility]: [phone]   Virtual Medical Office Visit - Video Telemedicine   Patient:  Jesse Steele ([birth year]) located at home MRN:   [id number]      Date:   06/03/2022  PCP:    Jesse Steele, [place] Provider: Loralee Pacas, MD located at office: [facility] [address], [place] [address] Today's Telemedicine visit was conducted via Video for [id number] consent for telemedicine was obtained:  Video connection was never lost All video encounter participant identities and locations confirmed visually and verbally.   Problem Focused Charting:   Medical Decision Making per Assessment/Plan   Jesse Steele seen today for sinus infection symptoms, cough and nasal congestion.  Other chronic sinusitis -     Amoxicillin; Take 10 mLs (800 mg total) by mouth 2 (two) times daily.  Dispense: 100 mL; Refill: 0 We discussed risks and benefits for his possible bacterial post-COVID sinus infection, I encouraged him to hold off on antibiotic(s) until 10 days have passed or if symptom(s) worsening.  Obesity due to energy imbalance Overview: BMI Readings from Last 5 Encounters:  04/24/22 36.55 kg/m  12/06/21 35.12 kg/m  11/08/21 34.42 kg/m  01/22/18 33.11 kg/m  01/19/18 32.90 kg/m  Comorbids: hyperlipidemia, GERD, erectile dysfunction, low testosterone  Assessment & Plan: Encouraged continuing with diet and exercise [facility]  Orders: -     Semaglutide-Weight Management; Inject 0.25 mg into the skin once a week for 28 days.  Dispense: 2 mL; Refill: 0 -     Semaglutide-Weight Management; Inject 0.5 mg into the skin once a week for 28 days.  Dispense: 2 mL; Refill: 0 -     Semaglutide-Weight Management; Inject 1 mg into the skin once a week for 28 days.  Dispense: 2 mL; Refill: 0 -     Semaglutide-Weight Management; Inject 1.7 mg into the skin once a week  for 28 days.  Dispense: 3 mL; Refill: 0 -     Semaglutide-Weight Management; Inject 2.4 mg into the skin once a week for 28 days.  Dispense: 3 mL; Refill: 0    I resubmitted WMancel Parsonsnow that we have new prior authorization support team available to help.    Subjective - Clinical Presentation:   Jesse Steele a 52y.o. male  Patient Active Problem List   Diagnosis Date Noted   Hyperlipidemia, acquired 05/01/2022   Hypertriglyceridemia 05/01/2022   Obesity due to energy imbalance 04/24/2022   Skin lesion 04/24/2022   Posterior tibial tendon dysfunction (PTTD) of both lower extremities 09/27/2021   Anxiety disorder 09/19/2019   Allergic rhinosinusitis 10/13/2017   Cough 10/13/2017   ED (erectile dysfunction) 07/06/2015   BPH (benign prostatic hyperplasia) 06/08/2014   H/O testicular mass 04/25/2014   Low serum testosterone 01/31/2014   GERD (gastroesophageal reflux disease) 05/27/2012   Liver lesion 05/27/2012   Stricture and stenosis of esophagus 11/03/2011   ADHD (attention deficit hyperactivity disorder) 10/30/2011   Past Medical History:  Diagnosis Date   ADHD (attention deficit hyperactivity disorder)    Anxiety    Diverticulitis    Diverticulosis    Duodenitis    Esophageal stricture    GERD (gastroesophageal reflux disease)    Hiatal hernia    Hyperlipidemia, acquired 05/01/2022   The 10-year ASCVD risk score (Arnett DK, et al., 2019) is: 3.3%   Values used to calculate the score:  Age: 15 years     Sex: Male     Is Non-Hispanic African American: No     Diabetic: No     Tobacco smoker: No     Systolic Blood Pressure: 616 mmHg     Is BP treated: No     HDL Cholesterol: 55.4 mg/dL     Total Cholesterol: 225 mg/dL    IBS (irritable bowel syndrome)    Liver lesion    Low testosterone    Lumbar degenerative disc disease    Obesity due to energy imbalance 04/24/2022   BMI Readings from Last 5 Encounters: 04/24/22 36.55 kg/m 12/06/21 35.12  kg/m 11/08/21 34.42 kg/m 01/22/18 33.11 kg/m 01/19/18 32.90 kg/m     Skin lesion 04/24/2022   Itchy raised spot for 1 week improved with neosporin- appears like a bug bite or maybe an abscess quite itchy    Outpatient Medications Prior to Visit  Medication Sig   azelastine (ASTELIN) 0.1 % nasal spray SPRAY 1 SPRAY INTO EACH NOSTRIL TWICE A DAY AS NEEDED   busPIRone (BUSPAR) 10 MG tablet Take 10 mg by mouth 2 (two) times daily.   Lisdexamfetamine Dimesylate 60 MG CHEW Chew by mouth.   [START ON 06/28/2022] Semaglutide-Weight Management 1 MG/0.5ML SOAJ Inject 1 mg into the skin once a week for 28 days.   [START ON 07/27/2022] Semaglutide-Weight Management 1.7 MG/0.75ML SOAJ Inject 1.7 mg into the skin once a week for 28 days.   [START ON 08/25/2022] Semaglutide-Weight Management 2.4 MG/0.75ML SOAJ Inject 2.4 mg into the skin once a week for 28 days.   tadalafil (CIALIS) 20 MG tablet Take 1 tablet (20 mg total) by mouth daily as needed for erectile dysfunction.   Testosterone 20.25 MG/ACT (1.62%) GEL SMARTSIG:1 Pump Topical Daily   tirzepatide (ZEPBOUND) 2.5 MG/0.5ML Pen Inject 2.5 mg into the skin once a week.   [DISCONTINUED] Semaglutide-Weight Management 0.5 MG/0.5ML SOAJ Inject 0.5 mg into the skin once a week for 28 days.   No facility-administered medications prior to visit.    Chief Complaint  Patient presents with   Sinus infection symptoms    All symptoms for about a week. Feeling much better today. Requesting sinus medication.   Cough   Nasal Congestion    HPI  Had COVID a week ago Has a sinus infection  Lost 4 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 he didn't even know he had COVID symptom(s) were so  mild. Request liquid form antibiotic(s) for persistent severe sinus symptom(s). He request we try again on Wegovy, hasn't been able to get any weight loss medications yet       Objective:  Physical Exam  BP 128/78 (BP Location: Left Arm, Patient Position: Sitting) Comment: taken at home Obese  by BMI criteria but truncal adiposity (waist circumference or caliper) should be used instead. Wt Readings from Last 10 Encounters:  05/01/22 239 lb (108.4 kg)  04/24/22 240 lb 6.4 oz (109 kg)  12/06/21 231 lb (104.8 kg)  11/08/21 231 lb 6 oz (105 kg)  01/22/18 224 lb 3.2 oz (101.7 kg)  01/19/18 222 lb 12.8 oz (101.1 kg)  01/08/18 223 lb (101.2 kg)  10/13/17 228 lb (103.4 kg)  06/05/17 226 lb (102.5 kg)  03/09/17 224 lb 12.8 oz (102 kg)   Vital signs reviewed.  Nursing notes reviewed. Weight trend reviewed. General Appearance:  Well developed, well nourished male in no acute distress.   Normal work of breathing at rest Musculoskeletal: All extremities are intact.  Neurological:  Awake, alert,  No obvious focal neurological deficits or cognitive impairments Psychiatric:  Appropriate mood, pleasant demeanor Problem-specific findings:  nasal congestion apparent in voice,.   Results Reviewed: No results found for any visits on 06/03/22.  Recent Results (from the past 2160 hour(s))  Lipid panel     Status: Abnormal   Collection Time: 04/24/22 11:32 AM  Result Value Ref Range   Cholesterol 225 (H) 0 - 200 mg/dL    Comment: ATP III Classification       Desirable:  < 200 mg/dL               Borderline High:  200 - 239 mg/dL          High:  > = 240 mg/dL   Triglycerides 276.0 (H) 0.0 - 149.0 mg/dL    Comment: Normal:  <150 mg/dLBorderline High:  150 - 199 mg/dL   HDL 55.40 >39.00 mg/dL   VLDL 55.2 (H) 0.0 - 40.0 mg/dL   Total CHOL/HDL Ratio 4     Comment:                Men          Women1/2 Average Risk     3.4          3.3Average Risk          5.0          4.42X Average Risk          9.6           7.13X Average Risk          15.0          11.0                       NonHDL 169.89     Comment: NOTE:  Non-HDL goal should be 30 mg/dL higher than patient's LDL goal (i.e. LDL goal of < 70 mg/dL, would have non-HDL goal of < 100 mg/dL)  Comp Met (CMET)     Status: None   Collection Time: 04/24/22 11:32 AM  Result Value Ref Range   Sodium 138 135 - 145 mEq/L   Potassium 4.3 3.5 - 5.1 mEq/L   Chloride 104 96 - 112 mEq/L   CO2 27 19 - 32 mEq/L   Glucose, Bld 97 70 - 99 mg/dL   BUN 11 6 - 23 mg/dL   Creatinine, Ser 0.80 0.40 - 1.50 mg/dL   Total Bilirubin 0.5 0.2 - 1.2 mg/dL   Alkaline Phosphatase 75 39 - 117 U/L   AST 23 0 - 37 U/L   ALT 38 0 - 53 U/L   Total Protein 6.7 6.0 - 8.3 g/dL   Albumin 4.6 3.5 - 5.2 g/dL   GFR 103.25 >60.00 mL/min    Comment: Calculated using the CKD-EPI Creatinine Equation (2021)   Calcium 9.5 8.4 - 10.5 mg/dL  CBC w/Diff     Status: None   Collection Time: 04/24/22 11:32 AM  Result Value Ref Range   WBC 7.4 4.0 - 10.5 K/uL   RBC 5.41 4.22 - 5.81 Mil/uL   Hemoglobin 16.1 13.0 - 17.0 g/dL   HCT 47.4 39.0 - 52.0 %  MCV 87.6 78.0 - 100.0 fl   MCHC 33.9 30.0 - 36.0 g/dL   RDW 13.6 11.5 - 15.5 %   Platelets 228.0 150.0 - 400.0 K/uL   Neutrophils Relative % 66.2 43.0 - 77.0 %   Lymphocytes Relative 22.3 12.0 - 46.0 %   Monocytes Relative 6.3 3.0 - 12.0 %   Eosinophils Relative 4.3 0.0 - 5.0 %   Basophils Relative 0.9 0.0 - 3.0 %   Neutro Abs 4.9 1.4 - 7.7 K/uL   Lymphs Abs 1.7 0.7 - 4.0 K/uL   Monocytes Absolute 0.5 0.1 - 1.0 K/uL   Eosinophils Absolute 0.3 0.0 - 0.7 K/uL   Basophils Absolute 0.1 0.0 - 0.1 K/uL  Testosterone Total,Free,Bio, Males-([facility])     Status: None   Collection Time: 04/24/22 11:32 AM  Result Value Ref Range   Testosterone 679 250 - 827 ng/dL   Albumin 4.4 3.6 - 5.1 g/dL   Sex Hormone Binding 27 10 - 50 nmol/L   Testosterone, Free 115.7 46.0 - 224.0 pg/mL   Testosterone, Bioavailable 233.0 110.0 - 575.0 ng/dL  LDL  cholesterol, direct     Status: None   Collection Time: 04/24/22 11:32 AM  Result Value Ref Range   Direct LDL 128.0 mg/dL    Comment: Optimal:  <100 mg/dLNear or Above Optimal:  100-129 mg/dLBorderline High:  130-159 mg/dLHigh:  160-189 mg/dLVery High:  >190 mg/dL          Signed: Loralee Pacas, MD 06/03/2022 10:52 AM

## 2022-06-09 ENCOUNTER — Other Ambulatory Visit (HOSPITAL_COMMUNITY): Payer: Self-pay

## 2022-06-09 ENCOUNTER — Telehealth: Payer: Self-pay

## 2022-06-09 NOTE — Telephone Encounter (Signed)
Pharmacy Patient Advocate Encounter   Received notification from [facility] that prior authorization for [facility] 0.'5mg'$ /0.41m is required/requested.  Per Test Claim: Prior authorization required   PA submitted on 06/09/22 to (ins) [employer] via CoverMyMeds Key [id number] Status is pending

## 2022-06-12 NOTE — Telephone Encounter (Signed)
Received a fax regarding Prior Authorization from [place] for [facility] 0.'5mg'$ /0.93m.   Authorization has been DENIED because the plan only covers this drug if you have been in a comprehensive weight management program for at least 6 months before starting this drug.  Submitted an appeal with updated information stating he has been in a comprehensive weight management program. Awaiting determination.   Phone# [phone]# [id number]

## 2022-06-13 NOTE — Telephone Encounter (Signed)
Pharmacy Patient Advocate Encounter  Prior Authorization for Mancel Parsons has been approved.    PA# [id number] a co Effective dates: 06/12/2022 through 01/11/2023

## 2022-06-18 NOTE — Telephone Encounter (Signed)
Noted thanks.

## 2022-06-19 DIAGNOSIS — R972 Elevated prostate specific antigen [PSA]: Secondary | ICD-10-CM | POA: Diagnosis not present

## 2022-06-19 DIAGNOSIS — E291 Testicular hypofunction: Secondary | ICD-10-CM | POA: Diagnosis not present

## 2022-06-19 NOTE — Telephone Encounter (Signed)
Pt would like the RX Wegov[place] to be sent to [facility] [[id number]]  Please advise

## 2022-06-23 ENCOUNTER — Other Ambulatory Visit: Payer: Self-pay

## 2022-06-26 DIAGNOSIS — R972 Elevated prostate specific antigen [PSA]: Secondary | ICD-10-CM | POA: Diagnosis not present

## 2022-06-26 DIAGNOSIS — E291 Testicular hypofunction: Secondary | ICD-10-CM | POA: Diagnosis not present

## 2022-07-03 ENCOUNTER — Telehealth: Payer: Self-pay | Admitting: Internal Medicine

## 2022-07-03 NOTE — Telephone Encounter (Signed)
Patient requests that [place] be faxed to [facility] prior to 07/09/22.   Patient requests to be contacted once the above has been completed and faxed.

## 2022-07-03 NOTE — Telephone Encounter (Signed)
Type of form received: [employer]   Additional comments:   Received by: Izora Gala  Form should be Faxed to: [phone]  Form should be mailed to:  no  Is patient requesting call for pickup: no/please fax   Form placed:  Folder  Attach charge sheet. yes  Individual made aware of 3-5 business day turn around (Y/N)? yes

## 2022-07-04 NOTE — Telephone Encounter (Signed)
Form was faxed 

## 2022-07-17 ENCOUNTER — Ambulatory Visit: Payer: BC Managed Care – PPO | Admitting: Physician Assistant

## 2022-07-17 ENCOUNTER — Encounter: Payer: Self-pay | Admitting: Physician Assistant

## 2022-07-17 VITALS — BP 142/92 | HR 85 | Temp 97.3°F | Ht 68.0 in | Wt 235.8 lb

## 2022-07-17 DIAGNOSIS — H9201 Otalgia, right ear: Secondary | ICD-10-CM

## 2022-07-17 DIAGNOSIS — R972 Elevated prostate specific antigen [PSA]: Secondary | ICD-10-CM | POA: Diagnosis not present

## 2022-07-17 DIAGNOSIS — H6121 Impacted cerumen, right ear: Secondary | ICD-10-CM | POA: Diagnosis not present

## 2022-07-17 DIAGNOSIS — R030 Elevated blood-pressure reading, without diagnosis of hypertension: Secondary | ICD-10-CM | POA: Diagnosis not present

## 2022-07-17 NOTE — Patient Instructions (Signed)
Your blood pressure was elevated in office today.  Please monitor and if seeing continued readings above 140/90, you need to follow-up with your PCP.  I hope your ear gets to feeling better.  Avoid Q-tips in the future.  You may use Debrox drops.

## 2022-07-17 NOTE — Progress Notes (Signed)
Subjective:    Patient ID: Jesse Steele, male    DOB: [birth year], 52 y.o.   MRN: [id number]  Chief Complaint  Patient presents with   Ear Pain    Pt in office c/o ear pain in right ear but starting to feel a little in left ear as well; pt has seasonal allergies; started using Asteline nasal spray yesterday    HPI Patient is in today for R ear pain x 1 week. Used a Q tip last week. Has been having some popping / plugged feeling. Some nasal allergies recently. No fever or chills.   Past Medical History:  Diagnosis Date   ADHD (attention deficit hyperactivity disorder)    Anxiety    Diverticulitis    Diverticulosis    Duodenitis    Esophageal stricture    GERD (gastroesophageal reflux disease)    Hiatal hernia    Hyperlipidemia, acquired 05/01/2022   The 10-year ASCVD risk score (Arnett DK, et al., 2019) is: 3.3%   Values used to calculate the score:     Age: 3 years     Sex: Male     Is Non-Hispanic African American: No     Diabetic: No     Tobacco smoker: No     Systolic Blood Pressure: 123XX123 mmHg     Is BP treated: No     HDL Cholesterol: 55.4 mg/dL     Total Cholesterol: 225 mg/dL    IBS (irritable bowel syndrome)    Liver lesion    Low testosterone    Lumbar degenerative disc disease    Obesity due to energy imbalance 04/24/2022   BMI Readings from Last 5 Encounters: 04/24/22 36.55 kg/m 12/06/21 35.12 kg/m 11/08/21 34.42 kg/m 01/22/18 33.11 kg/m 01/19/18 32.90 kg/m     Skin lesion 04/24/2022   Itchy raised spot for 1 week improved with neosporin- appears like a bug bite or maybe an abscess quite itchy    Past Surgical History:  Procedure Laterality Date   ADENOIDECTOMY     ESOPHAGOGASTRODUODENOSCOPY  11/03/2011   Procedure: ESOPHAGOGASTRODUODENOSCOPY (EGD);  Surgeon: Lafayette Dragon, MD;  Location: [facility];  Service: Endoscopy;  Laterality: N/A;   HERNIA REPAIR     SAVORY DILATION  11/03/2011   Procedure: SAVORY DILATION;  Surgeon: Lafayette Dragon, MD;  Location: [facility];  Service: Endoscopy;  Laterality: N/A;   TONSILLECTOMY     UMBILICAL HERNIA REPAIR     UPPER GASTROINTESTINAL ENDOSCOPY      Family History  Problem Relation Age of Onset   COPD Mother    Heart disease Mother    Hypertension Mother    Heart failure Mother    Depression Mother    Prostate cancer Father    Hypertension Father    COPD Father    Pneumonia Father    Heart failure Father    Gallstones Father    ADD / ADHD Father    Hypertension Sister    Gallstones Sister    Heart disease Maternal Grandmother    Kidney disease Paternal Grandmother    Hypertension Sister    Colon cancer Neg Hx     Social History   Tobacco Use   Smoking status: Never   Smokeless tobacco: Never  Vaping Use   Vaping Use: Never used  Substance Use Topics   Alcohol use: Yes    Alcohol/week: 4.0 standard drinks of alcohol    Types: 2 Cans of beer, 2 Standard drinks or equivalent per week  Comment: 1 every 4 days and weekends   Drug use: No     Allergies  Allergen Reactions   Codeine Other (See Comments)    Reaction unknown (family allergy)   Penicillins Nausea Only   Sulfa Antibiotics Nausea Only    Review of Systems NEGATIVE UNLESS OTHERWISE INDICATED IN HPI      Objective:     BP (!) 142/92 (BP Location: Left Arm)   Pulse 85   Temp (!) 97.3 F (36.3 C) (Temporal)   Ht '5\' 8"'$  (1.727 m)   Wt 235 lb 12.8 oz (107 kg)   SpO2 98%   BMI 35.85 kg/m   Wt Readings from Last 3 Encounters:  07/17/22 235 lb 12.8 oz (107 kg)  05/01/22 239 lb (108.4 kg)  04/24/22 240 lb 6.4 oz (109 kg)    BP Readings from Last 3 Encounters:  07/17/22 (!) 142/92  06/03/22 128/78  05/01/22 121/74     Physical Exam Vitals and nursing note reviewed.  Constitutional:      Appearance: Normal appearance.  HENT:     Right Ear: Tympanic membrane and external ear normal. There is impacted cerumen.     Left Ear: Tympanic membrane, ear canal and external ear  normal.     Nose: Nose normal.     Mouth/Throat:     Mouth: Mucous membranes are moist.  Eyes:     Extraocular Movements: Extraocular movements intact.     Conjunctiva/sclera: Conjunctivae normal.     Pupils: Pupils are equal, round, and reactive to light.  Cardiovascular:     Rate and Rhythm: Normal rate and regular rhythm.     Pulses: Normal pulses.  Pulmonary:     Effort: Pulmonary effort is normal.     Breath sounds: Normal breath sounds.  Neurological:     Mental Status: He is alert.        Assessment & Plan:  Acute ear pain, right  Impacted cerumen, right ear  Elevated blood pressure reading   R ear pain - on exam, appears secondary to cerumen impaction -   Ceruminosis is noted. Removal procedure explained and verbal consent obtained from patient. Wax is removed by syringing from R ear canal. TM noted to be clear. Pt tolerated procedure well. Instructions for home care to prevent wax buildup are given.  BP also noted to be elevated, no symptoms, no medications. He will monitor at home, follow with PCP.    Return if symptoms worsen or fail to improve.    Man Effertz M Lovie Zarling, PA-C

## 2022-08-22 ENCOUNTER — Other Ambulatory Visit (HOSPITAL_BASED_OUTPATIENT_CLINIC_OR_DEPARTMENT_OTHER): Payer: Self-pay

## 2022-08-22 ENCOUNTER — Other Ambulatory Visit: Payer: Self-pay

## 2022-08-22 DIAGNOSIS — F411 Generalized anxiety disorder: Secondary | ICD-10-CM | POA: Diagnosis not present

## 2022-08-22 DIAGNOSIS — F39 Unspecified mood [affective] disorder: Secondary | ICD-10-CM | POA: Diagnosis not present

## 2022-08-22 DIAGNOSIS — F900 Attention-deficit hyperactivity disorder, predominantly inattentive type: Secondary | ICD-10-CM | POA: Diagnosis not present

## 2022-08-22 MED ORDER — LISDEXAMFETAMINE DIMESYLATE 60 MG PO CHEW
60.0000 mg | CHEWABLE_TABLET | Freq: Every day | ORAL | 0 refills | Status: AC
  Filled 2022-08-22: qty 30, 30d supply, fill #0
  Filled 2022-08-22: qty 29, 29d supply, fill #0

## 2022-08-22 MED ORDER — BUSPIRONE HCL 10 MG PO TABS
20.0000 mg | ORAL_TABLET | Freq: Every day | ORAL | 5 refills | Status: AC
  Filled 2022-08-22: qty 60, 30d supply, fill #0

## 2022-08-22 MED ORDER — LISDEXAMFETAMINE DIMESYLATE 60 MG PO CHEW: CHEWABLE_TABLET | ORAL | 0 refills | Status: AC

## 2022-08-22 MED ORDER — LISDEXAMFETAMINE DIMESYLATE 60 MG PO CHEW
60.0000 mg | CHEWABLE_TABLET | Freq: Every day | ORAL | 0 refills | Status: AC
  Filled 2022-09-25: qty 30, 30d supply, fill #0

## 2022-08-22 MED ORDER — TOPIRAMATE 25 MG PO CPSP
25.0000 mg | ORAL_CAPSULE | Freq: Two times a day (BID) | ORAL | 2 refills | Status: DC
  Filled 2022-08-22: qty 60, 30d supply, fill #0

## 2022-08-23 ENCOUNTER — Other Ambulatory Visit (HOSPITAL_BASED_OUTPATIENT_CLINIC_OR_DEPARTMENT_OTHER): Payer: Self-pay

## 2022-08-25 ENCOUNTER — Other Ambulatory Visit: Payer: Self-pay

## 2022-08-25 ENCOUNTER — Other Ambulatory Visit (HOSPITAL_BASED_OUTPATIENT_CLINIC_OR_DEPARTMENT_OTHER): Payer: Self-pay

## 2022-09-23 ENCOUNTER — Other Ambulatory Visit (HOSPITAL_BASED_OUTPATIENT_CLINIC_OR_DEPARTMENT_OTHER): Payer: Self-pay

## 2022-09-23 ENCOUNTER — Other Ambulatory Visit: Payer: Self-pay

## 2022-09-23 DIAGNOSIS — E669 Obesity, unspecified: Secondary | ICD-10-CM

## 2022-09-23 MED ORDER — WEGOVY 1 MG/0.5ML ~~LOC~~ SOAJ
1.0000 mg | SUBCUTANEOUS | 0 refills | Status: AC
Start: 2022-11-17 — End: ?
  Filled 2022-09-23: qty 2, 28d supply, fill #0

## 2022-09-23 MED ORDER — WEGOVY 0.25 MG/0.5ML ~~LOC~~ SOAJ
0.2500 mg | SUBCUTANEOUS | 0 refills | Status: AC
Start: 2022-09-23 — End: ?
  Filled 2022-09-23: qty 2, 28d supply, fill #0

## 2022-09-23 MED ORDER — SEMAGLUTIDE-WEIGHT MANAGEMENT 2.4 MG/0.75ML ~~LOC~~ SOAJ
2.4000 mg | SUBCUTANEOUS | 12 refills | Status: AC
Start: 2022-12-15 — End: 2023-12-14
  Filled 2022-09-23: qty 3, fill #0

## 2022-09-23 MED ORDER — SEMAGLUTIDE-WEIGHT MANAGEMENT 1.7 MG/0.75ML ~~LOC~~ SOAJ
1.7000 mg | SUBCUTANEOUS | 0 refills | Status: AC
Start: 2022-12-08 — End: 2023-01-05
  Filled 2022-09-23: qty 3, 28d supply, fill #0

## 2022-09-23 MED ORDER — WEGOVY 0.5 MG/0.5ML ~~LOC~~ SOAJ
0.5000 mg | SUBCUTANEOUS | 0 refills | Status: AC
Start: 2022-10-20 — End: ?
  Filled 2022-09-23: qty 2, fill #0

## 2022-09-23 MED ORDER — WEGOVY 1 MG/0.5ML ~~LOC~~ SOAJ
1.0000 mg | SUBCUTANEOUS | 0 refills | Status: DC
Start: 2022-11-10 — End: 2022-09-23
  Filled 2022-09-23: qty 2, fill #0

## 2022-09-23 NOTE — Telephone Encounter (Signed)
Left vm for patient to call the office back concerning this. 

## 2022-09-25 ENCOUNTER — Other Ambulatory Visit (HOSPITAL_BASED_OUTPATIENT_CLINIC_OR_DEPARTMENT_OTHER): Payer: Self-pay

## 2022-10-15 ENCOUNTER — Other Ambulatory Visit (HOSPITAL_BASED_OUTPATIENT_CLINIC_OR_DEPARTMENT_OTHER): Payer: Self-pay

## 2022-11-14 ENCOUNTER — Other Ambulatory Visit: Payer: Self-pay

## 2022-11-14 ENCOUNTER — Encounter (HOSPITAL_BASED_OUTPATIENT_CLINIC_OR_DEPARTMENT_OTHER): Payer: Self-pay

## 2022-11-14 ENCOUNTER — Other Ambulatory Visit (HOSPITAL_BASED_OUTPATIENT_CLINIC_OR_DEPARTMENT_OTHER): Payer: Self-pay

## 2022-11-14 DIAGNOSIS — F411 Generalized anxiety disorder: Secondary | ICD-10-CM | POA: Diagnosis not present

## 2022-11-14 DIAGNOSIS — F900 Attention-deficit hyperactivity disorder, predominantly inattentive type: Secondary | ICD-10-CM | POA: Diagnosis not present

## 2022-11-14 DIAGNOSIS — F39 Unspecified mood [affective] disorder: Secondary | ICD-10-CM | POA: Diagnosis not present

## 2022-11-14 MED ORDER — LISDEXAMFETAMINE DIMESYLATE 60 MG PO CHEW
60.0000 mg | CHEWABLE_TABLET | Freq: Every day | ORAL | 0 refills | Status: AC
  Filled 2023-01-19: qty 30, 30d supply, fill #0

## 2022-11-14 MED ORDER — LISDEXAMFETAMINE DIMESYLATE 60 MG PO CHEW
60.0000 mg | CHEWABLE_TABLET | Freq: Every day | ORAL | 0 refills | Status: DC
  Filled 2022-11-14: qty 30, 30d supply, fill #0

## 2022-11-14 MED ORDER — LISDEXAMFETAMINE DIMESYLATE 60 MG PO CHEW
60.0000 mg | CHEWABLE_TABLET | Freq: Every day | ORAL | 0 refills | Status: DC
  Filled 2022-12-17: qty 30, 30d supply, fill #0

## 2022-12-17 ENCOUNTER — Other Ambulatory Visit: Payer: Self-pay

## 2022-12-17 ENCOUNTER — Other Ambulatory Visit (HOSPITAL_BASED_OUTPATIENT_CLINIC_OR_DEPARTMENT_OTHER): Payer: Self-pay

## 2022-12-18 ENCOUNTER — Other Ambulatory Visit (HOSPITAL_BASED_OUTPATIENT_CLINIC_OR_DEPARTMENT_OTHER): Payer: Self-pay

## 2022-12-22 DIAGNOSIS — R972 Elevated prostate specific antigen [PSA]: Secondary | ICD-10-CM | POA: Diagnosis not present

## 2022-12-25 DIAGNOSIS — E291 Testicular hypofunction: Secondary | ICD-10-CM | POA: Diagnosis not present

## 2022-12-26 ENCOUNTER — Other Ambulatory Visit (HOSPITAL_BASED_OUTPATIENT_CLINIC_OR_DEPARTMENT_OTHER): Payer: Self-pay

## 2022-12-26 DIAGNOSIS — E291 Testicular hypofunction: Secondary | ICD-10-CM | POA: Diagnosis not present

## 2022-12-26 DIAGNOSIS — N528 Other male erectile dysfunction: Secondary | ICD-10-CM | POA: Diagnosis not present

## 2022-12-26 MED ORDER — TESTOSTERONE 1.62 % TD GEL
1.0000 | Freq: Every day | TRANSDERMAL | 5 refills | Status: DC
  Filled 2023-01-26: qty 75, 30d supply, fill #0
  Filled 2023-02-20: qty 75, 30d supply, fill #1
  Filled 2023-03-17 – 2023-03-27 (×2): qty 75, 30d supply, fill #2
  Filled 2023-04-24: qty 75, 30d supply, fill #3
  Filled 2023-05-25: qty 75, 30d supply, fill #4
  Filled 2023-06-23: qty 75, 30d supply, fill #5

## 2023-01-16 ENCOUNTER — Other Ambulatory Visit (HOSPITAL_BASED_OUTPATIENT_CLINIC_OR_DEPARTMENT_OTHER): Payer: Self-pay

## 2023-01-19 ENCOUNTER — Other Ambulatory Visit: Payer: Self-pay

## 2023-01-19 ENCOUNTER — Other Ambulatory Visit (HOSPITAL_BASED_OUTPATIENT_CLINIC_OR_DEPARTMENT_OTHER): Payer: Self-pay

## 2023-01-26 ENCOUNTER — Other Ambulatory Visit (HOSPITAL_BASED_OUTPATIENT_CLINIC_OR_DEPARTMENT_OTHER): Payer: Self-pay

## 2023-01-26 ENCOUNTER — Other Ambulatory Visit: Payer: Self-pay

## 2023-02-11 ENCOUNTER — Other Ambulatory Visit: Payer: Self-pay

## 2023-02-11 ENCOUNTER — Other Ambulatory Visit (HOSPITAL_BASED_OUTPATIENT_CLINIC_OR_DEPARTMENT_OTHER): Payer: Self-pay

## 2023-02-11 DIAGNOSIS — F411 Generalized anxiety disorder: Secondary | ICD-10-CM | POA: Diagnosis not present

## 2023-02-11 DIAGNOSIS — F900 Attention-deficit hyperactivity disorder, predominantly inattentive type: Secondary | ICD-10-CM | POA: Diagnosis not present

## 2023-02-11 DIAGNOSIS — F39 Unspecified mood [affective] disorder: Secondary | ICD-10-CM | POA: Diagnosis not present

## 2023-02-11 MED ORDER — LISDEXAMFETAMINE DIMESYLATE 60 MG PO CHEW
60.0000 mg | CHEWABLE_TABLET | Freq: Every day | ORAL | 0 refills | Status: AC
  Filled 2023-02-20: qty 30, 30d supply, fill #0

## 2023-02-11 MED ORDER — LISDEXAMFETAMINE DIMESYLATE 60 MG PO CHEW
60.0000 mg | CHEWABLE_TABLET | Freq: Every day | ORAL | 0 refills | Status: AC
  Filled 2023-04-24: qty 30, 30d supply, fill #0

## 2023-02-11 MED ORDER — LISDEXAMFETAMINE DIMESYLATE 60 MG PO CHEW
60.0000 mg | CHEWABLE_TABLET | Freq: Every day | ORAL | 0 refills | Status: AC
  Filled 2023-03-27 (×2): qty 30, 30d supply, fill #0

## 2023-02-11 MED ORDER — BUSPIRONE HCL 10 MG PO TABS
20.0000 mg | ORAL_TABLET | Freq: Every day | ORAL | 5 refills | Status: AC
  Filled 2023-02-11: qty 60, 30d supply, fill #0

## 2023-02-16 DIAGNOSIS — F419 Anxiety disorder, unspecified: Secondary | ICD-10-CM | POA: Diagnosis not present

## 2023-02-20 ENCOUNTER — Other Ambulatory Visit (HOSPITAL_BASED_OUTPATIENT_CLINIC_OR_DEPARTMENT_OTHER): Payer: Self-pay

## 2023-02-21 ENCOUNTER — Other Ambulatory Visit (HOSPITAL_BASED_OUTPATIENT_CLINIC_OR_DEPARTMENT_OTHER): Payer: Self-pay

## 2023-02-23 ENCOUNTER — Other Ambulatory Visit (HOSPITAL_BASED_OUTPATIENT_CLINIC_OR_DEPARTMENT_OTHER): Payer: Self-pay

## 2023-02-24 ENCOUNTER — Other Ambulatory Visit (HOSPITAL_BASED_OUTPATIENT_CLINIC_OR_DEPARTMENT_OTHER): Payer: Self-pay

## 2023-02-24 DIAGNOSIS — F419 Anxiety disorder, unspecified: Secondary | ICD-10-CM | POA: Diagnosis not present

## 2023-03-04 DIAGNOSIS — F419 Anxiety disorder, unspecified: Secondary | ICD-10-CM | POA: Diagnosis not present

## 2023-03-17 ENCOUNTER — Other Ambulatory Visit: Payer: Self-pay | Admitting: Internal Medicine

## 2023-03-17 DIAGNOSIS — F419 Anxiety disorder, unspecified: Secondary | ICD-10-CM | POA: Diagnosis not present

## 2023-03-17 DIAGNOSIS — F900 Attention-deficit hyperactivity disorder, predominantly inattentive type: Secondary | ICD-10-CM

## 2023-03-17 NOTE — Telephone Encounter (Signed)
Rx's for Vyvanse chew were already sent to pharmacy by Dr. Jon Billings.

## 2023-03-24 DIAGNOSIS — F419 Anxiety disorder, unspecified: Secondary | ICD-10-CM | POA: Diagnosis not present

## 2023-03-27 ENCOUNTER — Other Ambulatory Visit (HOSPITAL_BASED_OUTPATIENT_CLINIC_OR_DEPARTMENT_OTHER): Payer: Self-pay

## 2023-04-07 DIAGNOSIS — F419 Anxiety disorder, unspecified: Secondary | ICD-10-CM | POA: Diagnosis not present

## 2023-04-24 ENCOUNTER — Other Ambulatory Visit (HOSPITAL_BASED_OUTPATIENT_CLINIC_OR_DEPARTMENT_OTHER): Payer: Self-pay

## 2023-05-14 ENCOUNTER — Other Ambulatory Visit (HOSPITAL_BASED_OUTPATIENT_CLINIC_OR_DEPARTMENT_OTHER): Payer: Self-pay

## 2023-05-14 DIAGNOSIS — F39 Unspecified mood [affective] disorder: Secondary | ICD-10-CM | POA: Diagnosis not present

## 2023-05-14 DIAGNOSIS — F900 Attention-deficit hyperactivity disorder, predominantly inattentive type: Secondary | ICD-10-CM | POA: Diagnosis not present

## 2023-05-14 DIAGNOSIS — F411 Generalized anxiety disorder: Secondary | ICD-10-CM | POA: Diagnosis not present

## 2023-05-14 MED ORDER — LISDEXAMFETAMINE DIMESYLATE 60 MG PO CHEW
60.0000 mg | CHEWABLE_TABLET | Freq: Every day | ORAL | 0 refills | Status: DC
  Filled 2023-05-14 – 2023-05-25 (×2): qty 30, 30d supply, fill #0

## 2023-05-14 MED ORDER — LISDEXAMFETAMINE DIMESYLATE 60 MG PO CHEW
60.0000 mg | CHEWABLE_TABLET | Freq: Every day | ORAL | 0 refills | Status: AC
  Filled 2023-06-23: qty 30, 30d supply, fill #0

## 2023-05-14 MED ORDER — LISDEXAMFETAMINE DIMESYLATE 60 MG PO CHEW
60.0000 mg | CHEWABLE_TABLET | Freq: Every day | ORAL | 0 refills | Status: DC
  Filled 2023-07-22: qty 30, 30d supply, fill #0

## 2023-05-25 ENCOUNTER — Other Ambulatory Visit: Payer: Self-pay

## 2023-05-25 ENCOUNTER — Other Ambulatory Visit (HOSPITAL_BASED_OUTPATIENT_CLINIC_OR_DEPARTMENT_OTHER): Payer: Self-pay

## 2023-06-23 ENCOUNTER — Other Ambulatory Visit (HOSPITAL_BASED_OUTPATIENT_CLINIC_OR_DEPARTMENT_OTHER): Payer: Self-pay

## 2023-06-23 ENCOUNTER — Other Ambulatory Visit: Payer: Self-pay

## 2023-06-28 ENCOUNTER — Encounter: Payer: Self-pay | Admitting: Internal Medicine

## 2023-07-02 NOTE — Telephone Encounter (Signed)
 MyChart secure digital messaging clinical encounter  Chief Complaint: Patient inquiry about elevated hemoglobin (16.0 g/dL) in context of testosterone replacement therapy.  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 Level remains within acceptable range per Endocrine Society Clinical Practice Guidelines, which indicate increased monitoring for levels >17.5 g/dL.  Medical Decision Making: Moderate complexity based on: - Multiple diagnoses requiring monitoring - Trending of serial hemoglobin levels - Review of testosterone therapy risks/benefits - Coordination with urology care - Integration of current clinical guidelines  Plan: 1. Continue current testosterone replacement per urology 2. Maintain regular monitoring schedule 3. Patient education provided regarding:    - Expected effects of testosterone on hemoglobin    - Warning signs requiring urgent evaluation    - Importance of hydration 4. Follow-up with urology for primary management 5. Referenced current Endocrine Society guidelines for testosterone therapy monitoring  Current Guidelines Reference: Per Endocrine Society Clinical Practice Guidelines (Bhasin et al., 2018): - Monitor hematocrit at baseline, 3-6 months after starting treatment, and then annually - Evaluate for sleep apnea in men with hematocrit >52% - Consider therapeutic phlebotomy for persistent hematocrit >54%  Please see the MyChart message reply(ies) for my assessment and  plan.  This patient gave consent for this Medical Advice Message and is aware that it may result in a bill to [employer], as well as the possibility of receiving a bill for a co-payment or deductible. They are an established patient, but are not seeking medical advice exclusively about a problem treated during an in-person or video visit in the last seven days. I did not recommend an in-person or video visit within seven days of my reply.  I spent a total of 15 minutes cumulative time within 7 days through [employer], including: - Review of medical records and lab trending: 5 minutes - Clinical decision making and guideline review: 5 minutes - Patient education and communication: 3 minutes - Documentation: 2 minutes  Lula Olszewski, MD

## 2023-07-02 NOTE — Telephone Encounter (Signed)
 See note

## 2023-07-20 ENCOUNTER — Other Ambulatory Visit (HOSPITAL_BASED_OUTPATIENT_CLINIC_OR_DEPARTMENT_OTHER): Payer: Self-pay

## 2023-07-21 ENCOUNTER — Other Ambulatory Visit (HOSPITAL_BASED_OUTPATIENT_CLINIC_OR_DEPARTMENT_OTHER): Payer: Self-pay

## 2023-07-22 ENCOUNTER — Other Ambulatory Visit (HOSPITAL_BASED_OUTPATIENT_CLINIC_OR_DEPARTMENT_OTHER): Payer: Self-pay

## 2023-07-23 ENCOUNTER — Other Ambulatory Visit (HOSPITAL_BASED_OUTPATIENT_CLINIC_OR_DEPARTMENT_OTHER): Payer: Self-pay

## 2023-07-23 MED ORDER — TESTOSTERONE 1.62 % TD GEL
1.0000 | Freq: Every day | TRANSDERMAL | 5 refills | Status: DC
Start: 2023-07-23 — End: 2023-12-28
  Filled 2023-07-23: qty 75, 30d supply, fill #0
  Filled 2023-08-20: qty 75, 30d supply, fill #1
  Filled 2023-09-19: qty 75, 30d supply, fill #2
  Filled 2023-10-19: qty 75, 30d supply, fill #3
  Filled 2023-11-16: qty 75, 30d supply, fill #4
  Filled 2023-12-16: qty 75, 30d supply, fill #5

## 2023-08-07 ENCOUNTER — Other Ambulatory Visit (HOSPITAL_BASED_OUTPATIENT_CLINIC_OR_DEPARTMENT_OTHER): Payer: Self-pay

## 2023-08-07 ENCOUNTER — Other Ambulatory Visit: Payer: Self-pay

## 2023-08-07 DIAGNOSIS — F39 Unspecified mood [affective] disorder: Secondary | ICD-10-CM | POA: Diagnosis not present

## 2023-08-07 DIAGNOSIS — F411 Generalized anxiety disorder: Secondary | ICD-10-CM | POA: Diagnosis not present

## 2023-08-07 DIAGNOSIS — F900 Attention-deficit hyperactivity disorder, predominantly inattentive type: Secondary | ICD-10-CM | POA: Diagnosis not present

## 2023-08-07 MED ORDER — LISDEXAMFETAMINE DIMESYLATE 60 MG PO CHEW
60.0000 mg | CHEWABLE_TABLET | Freq: Every day | ORAL | 0 refills | Status: DC
  Filled 2023-09-19: qty 30, 30d supply, fill #0

## 2023-08-07 MED ORDER — BUSPIRONE HCL 10 MG PO TABS
20.0000 mg | ORAL_TABLET | Freq: Every day | ORAL | 5 refills | Status: AC
  Filled 2023-08-07: qty 60, 30d supply, fill #0

## 2023-08-07 MED ORDER — LISDEXAMFETAMINE DIMESYLATE 60 MG PO CHEW
60.0000 mg | CHEWABLE_TABLET | Freq: Every day | ORAL | 0 refills | Status: DC
Start: 2023-10-14 — End: 2023-11-02
  Filled 2023-10-26: qty 30, 30d supply, fill #0

## 2023-08-07 MED ORDER — LISDEXAMFETAMINE DIMESYLATE 60 MG PO CHEW
60.0000 mg | CHEWABLE_TABLET | Freq: Every day | ORAL | 0 refills | Status: AC
Start: 2023-08-19 — End: ?
  Filled 2023-08-19 – 2023-08-20 (×2): qty 30, 30d supply, fill #0
  Filled ????-??-??: fill #0

## 2023-08-17 ENCOUNTER — Other Ambulatory Visit (HOSPITAL_BASED_OUTPATIENT_CLINIC_OR_DEPARTMENT_OTHER): Payer: Self-pay

## 2023-08-19 ENCOUNTER — Other Ambulatory Visit (HOSPITAL_BASED_OUTPATIENT_CLINIC_OR_DEPARTMENT_OTHER): Payer: Self-pay

## 2023-08-20 ENCOUNTER — Other Ambulatory Visit (HOSPITAL_BASED_OUTPATIENT_CLINIC_OR_DEPARTMENT_OTHER): Payer: Self-pay

## 2023-08-20 ENCOUNTER — Other Ambulatory Visit: Payer: Self-pay

## 2023-08-21 ENCOUNTER — Other Ambulatory Visit (HOSPITAL_BASED_OUTPATIENT_CLINIC_OR_DEPARTMENT_OTHER): Payer: Self-pay

## 2023-09-19 ENCOUNTER — Other Ambulatory Visit (HOSPITAL_BASED_OUTPATIENT_CLINIC_OR_DEPARTMENT_OTHER): Payer: Self-pay

## 2023-09-21 ENCOUNTER — Other Ambulatory Visit (HOSPITAL_BASED_OUTPATIENT_CLINIC_OR_DEPARTMENT_OTHER): Payer: Self-pay

## 2023-09-21 ENCOUNTER — Other Ambulatory Visit: Payer: Self-pay

## 2023-10-16 ENCOUNTER — Other Ambulatory Visit (HOSPITAL_BASED_OUTPATIENT_CLINIC_OR_DEPARTMENT_OTHER): Payer: Self-pay

## 2023-10-19 ENCOUNTER — Other Ambulatory Visit (HOSPITAL_BASED_OUTPATIENT_CLINIC_OR_DEPARTMENT_OTHER): Payer: Self-pay

## 2023-10-26 ENCOUNTER — Other Ambulatory Visit (HOSPITAL_BASED_OUTPATIENT_CLINIC_OR_DEPARTMENT_OTHER): Payer: Self-pay

## 2023-10-27 DIAGNOSIS — F419 Anxiety disorder, unspecified: Secondary | ICD-10-CM | POA: Diagnosis not present

## 2023-11-02 ENCOUNTER — Other Ambulatory Visit (HOSPITAL_BASED_OUTPATIENT_CLINIC_OR_DEPARTMENT_OTHER): Payer: Self-pay

## 2023-11-02 DIAGNOSIS — F411 Generalized anxiety disorder: Secondary | ICD-10-CM | POA: Diagnosis not present

## 2023-11-02 DIAGNOSIS — F39 Unspecified mood [affective] disorder: Secondary | ICD-10-CM | POA: Diagnosis not present

## 2023-11-02 DIAGNOSIS — F900 Attention-deficit hyperactivity disorder, predominantly inattentive type: Secondary | ICD-10-CM | POA: Diagnosis not present

## 2023-11-02 MED ORDER — LISDEXAMFETAMINE DIMESYLATE 60 MG PO CHEW
60.0000 mg | CHEWABLE_TABLET | Freq: Every day | ORAL | 0 refills | Status: DC
  Filled 2023-11-25: qty 30, 30d supply, fill #0

## 2023-11-02 MED ORDER — LISDEXAMFETAMINE DIMESYLATE 60 MG PO CHEW
60.0000 mg | CHEWABLE_TABLET | Freq: Every day | ORAL | 0 refills | Status: DC
  Filled 2023-12-25: qty 30, 30d supply, fill #0

## 2023-11-02 MED ORDER — LISDEXAMFETAMINE DIMESYLATE 60 MG PO CHEW
60.0000 mg | CHEWABLE_TABLET | Freq: Every day | ORAL | 0 refills | Status: AC
  Filled 2024-02-11: qty 30, 30d supply, fill #0

## 2023-11-10 DIAGNOSIS — F419 Anxiety disorder, unspecified: Secondary | ICD-10-CM | POA: Diagnosis not present

## 2023-11-12 ENCOUNTER — Other Ambulatory Visit (HOSPITAL_BASED_OUTPATIENT_CLINIC_OR_DEPARTMENT_OTHER): Payer: Self-pay

## 2023-11-16 ENCOUNTER — Other Ambulatory Visit: Payer: Self-pay

## 2023-11-24 ENCOUNTER — Other Ambulatory Visit (HOSPITAL_BASED_OUTPATIENT_CLINIC_OR_DEPARTMENT_OTHER): Payer: Self-pay

## 2023-11-24 DIAGNOSIS — F419 Anxiety disorder, unspecified: Secondary | ICD-10-CM | POA: Diagnosis not present

## 2023-11-25 ENCOUNTER — Other Ambulatory Visit (HOSPITAL_BASED_OUTPATIENT_CLINIC_OR_DEPARTMENT_OTHER): Payer: Self-pay

## 2023-12-08 DIAGNOSIS — F419 Anxiety disorder, unspecified: Secondary | ICD-10-CM | POA: Diagnosis not present

## 2023-12-15 DIAGNOSIS — F419 Anxiety disorder, unspecified: Secondary | ICD-10-CM | POA: Diagnosis not present

## 2023-12-16 ENCOUNTER — Other Ambulatory Visit (HOSPITAL_BASED_OUTPATIENT_CLINIC_OR_DEPARTMENT_OTHER): Payer: Self-pay

## 2023-12-21 DIAGNOSIS — E291 Testicular hypofunction: Secondary | ICD-10-CM | POA: Diagnosis not present

## 2023-12-25 ENCOUNTER — Other Ambulatory Visit (HOSPITAL_BASED_OUTPATIENT_CLINIC_OR_DEPARTMENT_OTHER): Payer: Self-pay

## 2023-12-28 ENCOUNTER — Other Ambulatory Visit (HOSPITAL_BASED_OUTPATIENT_CLINIC_OR_DEPARTMENT_OTHER): Payer: Self-pay

## 2023-12-28 MED ORDER — TESTOSTERONE 1.62 % TD GEL
1.0000 | Freq: Every day | TRANSDERMAL | 5 refills | Status: AC
  Filled 2023-12-28 – 2024-01-12 (×2): qty 75, 30d supply, fill #0
  Filled 2024-02-10: qty 75, 30d supply, fill #1
  Filled 2024-03-14: qty 75, 30d supply, fill #2
  Filled 2024-04-12: qty 75, 30d supply, fill #3
  Filled 2024-05-13: qty 75, 30d supply, fill #4
  Filled 2024-06-10: qty 75, 30d supply, fill #5

## 2023-12-30 DIAGNOSIS — F419 Anxiety disorder, unspecified: Secondary | ICD-10-CM | POA: Diagnosis not present

## 2024-01-12 ENCOUNTER — Other Ambulatory Visit (HOSPITAL_BASED_OUTPATIENT_CLINIC_OR_DEPARTMENT_OTHER): Payer: Self-pay

## 2024-01-13 DIAGNOSIS — F411 Generalized anxiety disorder: Secondary | ICD-10-CM | POA: Diagnosis not present

## 2024-01-21 DIAGNOSIS — F411 Generalized anxiety disorder: Secondary | ICD-10-CM | POA: Diagnosis not present

## 2024-02-03 DIAGNOSIS — F411 Generalized anxiety disorder: Secondary | ICD-10-CM | POA: Diagnosis not present

## 2024-02-10 ENCOUNTER — Other Ambulatory Visit (HOSPITAL_BASED_OUTPATIENT_CLINIC_OR_DEPARTMENT_OTHER): Payer: Self-pay

## 2024-02-11 ENCOUNTER — Other Ambulatory Visit (HOSPITAL_BASED_OUTPATIENT_CLINIC_OR_DEPARTMENT_OTHER): Payer: Self-pay

## 2024-02-22 ENCOUNTER — Other Ambulatory Visit (HOSPITAL_BASED_OUTPATIENT_CLINIC_OR_DEPARTMENT_OTHER): Payer: Self-pay

## 2024-02-22 ENCOUNTER — Other Ambulatory Visit: Payer: Self-pay

## 2024-02-22 DIAGNOSIS — F39 Unspecified mood [affective] disorder: Secondary | ICD-10-CM | POA: Diagnosis not present

## 2024-02-22 DIAGNOSIS — F411 Generalized anxiety disorder: Secondary | ICD-10-CM | POA: Diagnosis not present

## 2024-02-22 DIAGNOSIS — F900 Attention-deficit hyperactivity disorder, predominantly inattentive type: Secondary | ICD-10-CM | POA: Diagnosis not present

## 2024-02-22 MED ORDER — BUSPIRONE HCL 10 MG PO TABS
20.0000 mg | ORAL_TABLET | Freq: Every day | ORAL | 5 refills | Status: AC
  Filled 2024-02-22: qty 60, 30d supply, fill #0

## 2024-02-22 MED ORDER — LISDEXAMFETAMINE DIMESYLATE 60 MG PO CHEW
60.0000 mg | CHEWABLE_TABLET | Freq: Every day | ORAL | 0 refills | Status: DC
  Filled 2024-05-20: qty 30, 30d supply, fill #0

## 2024-02-22 MED ORDER — LISDEXAMFETAMINE DIMESYLATE 60 MG PO CHEW
60.0000 mg | CHEWABLE_TABLET | Freq: Every day | ORAL | 0 refills | Status: AC
  Filled 2024-03-19: qty 30, 30d supply, fill #0

## 2024-02-22 MED ORDER — LISDEXAMFETAMINE DIMESYLATE 60 MG PO CHEW
60.0000 mg | CHEWABLE_TABLET | Freq: Every day | ORAL | 0 refills | Status: DC
  Filled 2024-04-19: qty 30, 30d supply, fill #0

## 2024-03-03 ENCOUNTER — Other Ambulatory Visit (HOSPITAL_BASED_OUTPATIENT_CLINIC_OR_DEPARTMENT_OTHER): Payer: Self-pay

## 2024-03-14 ENCOUNTER — Other Ambulatory Visit (HOSPITAL_BASED_OUTPATIENT_CLINIC_OR_DEPARTMENT_OTHER): Payer: Self-pay

## 2024-03-15 ENCOUNTER — Other Ambulatory Visit (HOSPITAL_BASED_OUTPATIENT_CLINIC_OR_DEPARTMENT_OTHER): Payer: Self-pay

## 2024-03-19 ENCOUNTER — Other Ambulatory Visit (HOSPITAL_BASED_OUTPATIENT_CLINIC_OR_DEPARTMENT_OTHER): Payer: Self-pay

## 2024-03-21 ENCOUNTER — Other Ambulatory Visit: Payer: Self-pay

## 2024-03-21 ENCOUNTER — Other Ambulatory Visit (HOSPITAL_BASED_OUTPATIENT_CLINIC_OR_DEPARTMENT_OTHER): Payer: Self-pay

## 2024-03-23 DIAGNOSIS — F411 Generalized anxiety disorder: Secondary | ICD-10-CM | POA: Diagnosis not present

## 2024-03-25 DIAGNOSIS — F39 Unspecified mood [affective] disorder: Secondary | ICD-10-CM | POA: Diagnosis not present

## 2024-03-25 DIAGNOSIS — F429 Obsessive-compulsive disorder, unspecified: Secondary | ICD-10-CM | POA: Diagnosis not present

## 2024-03-25 DIAGNOSIS — F411 Generalized anxiety disorder: Secondary | ICD-10-CM | POA: Diagnosis not present

## 2024-03-25 DIAGNOSIS — F900 Attention-deficit hyperactivity disorder, predominantly inattentive type: Secondary | ICD-10-CM | POA: Diagnosis not present

## 2024-04-12 ENCOUNTER — Other Ambulatory Visit (HOSPITAL_BASED_OUTPATIENT_CLINIC_OR_DEPARTMENT_OTHER): Payer: Self-pay

## 2024-04-13 DIAGNOSIS — F411 Generalized anxiety disorder: Secondary | ICD-10-CM | POA: Diagnosis not present

## 2024-04-19 ENCOUNTER — Other Ambulatory Visit (HOSPITAL_BASED_OUTPATIENT_CLINIC_OR_DEPARTMENT_OTHER): Payer: Self-pay

## 2024-04-27 DIAGNOSIS — F411 Generalized anxiety disorder: Secondary | ICD-10-CM | POA: Diagnosis not present

## 2024-05-13 ENCOUNTER — Other Ambulatory Visit (HOSPITAL_BASED_OUTPATIENT_CLINIC_OR_DEPARTMENT_OTHER): Payer: Self-pay

## 2024-05-20 ENCOUNTER — Other Ambulatory Visit (HOSPITAL_BASED_OUTPATIENT_CLINIC_OR_DEPARTMENT_OTHER): Payer: Self-pay

## 2024-05-20 ENCOUNTER — Other Ambulatory Visit: Payer: Self-pay

## 2024-06-10 ENCOUNTER — Other Ambulatory Visit (HOSPITAL_BASED_OUTPATIENT_CLINIC_OR_DEPARTMENT_OTHER): Payer: Self-pay

## 2024-06-10 ENCOUNTER — Other Ambulatory Visit: Payer: Self-pay

## 2024-06-10 MED ORDER — LISDEXAMFETAMINE DIMESYLATE 60 MG PO CHEW: 60.0000 mg | CHEWABLE_TABLET | Freq: Every day | ORAL | 0 refills | Status: AC

## 2024-06-10 MED ORDER — BUSPIRONE HCL 10 MG PO TABS
20.0000 mg | ORAL_TABLET | Freq: Every day | ORAL | 5 refills | Status: AC
  Filled 2024-06-10: qty 60, 30d supply, fill #0

## 2024-06-13 ENCOUNTER — Ambulatory Visit: Admitting: Podiatry

## 2024-06-15 ENCOUNTER — Ambulatory Visit (INDEPENDENT_AMBULATORY_CARE_PROVIDER_SITE_OTHER)

## 2024-06-15 ENCOUNTER — Encounter: Payer: Self-pay | Admitting: Podiatry

## 2024-06-15 ENCOUNTER — Ambulatory Visit: Admitting: Podiatry

## 2024-06-15 VITALS — Ht 68.0 in | Wt 235.8 lb

## 2024-06-15 DIAGNOSIS — M2141 Flat foot [pes planus] (acquired), right foot: Secondary | ICD-10-CM | POA: Diagnosis not present

## 2024-06-15 DIAGNOSIS — M7751 Other enthesopathy of right foot: Secondary | ICD-10-CM | POA: Diagnosis not present

## 2024-06-15 DIAGNOSIS — M2142 Flat foot [pes planus] (acquired), left foot: Secondary | ICD-10-CM | POA: Diagnosis not present

## 2024-06-15 DIAGNOSIS — M7752 Other enthesopathy of left foot: Secondary | ICD-10-CM

## 2024-06-15 MED ORDER — METHYLPREDNISOLONE 4 MG PO TBPK: ORAL_TABLET | ORAL | 0 refills | Status: AC

## 2024-06-15 MED ORDER — MELOXICAM 15 MG PO TABS: 15.0000 mg | ORAL_TABLET | Freq: Every day | ORAL | 1 refills | Status: AC

## 2024-06-15 NOTE — Addendum Note (Signed)
 Addended by: JANIT THRESA HERO on: 06/15/2024 11:44 AM   Modules accepted: Orders

## 2024-06-15 NOTE — Progress Notes (Signed)
 "  Chief Complaint  Patient presents with   Foot Pain    Pt is here due to bilateral foot pain, states the right hurts more then the left, states he has flat feet and that his feet are always in pain, states he got custom orthotics made here about 5 years ago interested in getting some new ones.    Subjective:  54 y.o. male presenting today as a reestablish new patient for evaluation of bilateral foot pain ongoing for several years now.  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  He states that he is on his feet all day.   Past Medical History:  Diagnosis Date   ADHD (attention deficit hyperactivity disorder)    Anxiety    Diverticulitis    Diverticulosis    Duodenitis    Esophageal stricture    GERD (gastroesophageal reflux disease)    Hiatal hernia    Hyperlipidemia, acquired 05/01/2022   The 10-year ASCVD risk score (Arnett DK, et al., 2019) is: 3.3%   Values used to calculate the score:     Age: 43 years     Sex: Male     Is Non-Hispanic African American: No     Diabetic: No     Tobacco smoker: No     Systolic Blood Pressure: 121 mmHg     Is BP treated: No     HDL Cholesterol: 55.4 mg/dL     Total Cholesterol: 225 mg/dL    IBS (irritable bowel syndrome)    Liver lesion    Low testosterone     Lumbar degenerative disc disease    Obesity due to energy imbalance 04/24/2022   BMI Readings from Last 5 Encounters: 04/24/22 36.55 kg/m 12/06/21 35.12 kg/m 11/08/21 34.42 kg/m 01/22/18 33.11 kg/m 01/19/18 32.90 kg/m     Skin lesion 04/24/2022   Itchy raised spot for 1 week improved with neosporin- appears like a bug bite or maybe an abscess quite itchy       Objective/Physical Exam General: The patient is alert and oriented x3 in no acute distress.  Dermatology: Skin is warm, dry and supple bilateral lower extremities. Negative for open lesions or macerations.  Vascular: Palpable pedal pulses bilaterally. No edema or  erythema noted. Capillary refill within normal limits.  Neurological: Grossly intact via light touch  Musculoskeletal Exam: Range of motion within normal limits to all pedal and ankle joints bilateral. Muscle strength 5/5 in all groups bilateral.  Upon weightbearing there is a medial longitudinal arch collapse bilaterally. Remove foot valgus noted to the bilateral lower extremities with excessive pronation upon mid stance.  Good range of motion noted to the subtalar and midtarsal joints of the foot.  Clinically no concern for tarsal coalition or rigidity Tenderness to palpation also noted to the posterior aspect of the right heel consistent with an insertional Achilles tendinitis  Radiographic Exam B/L feet 06/15/2024:  Normal osseous mineralization. Joint spaces mostly preserved. No fracture/dislocation/boney destruction.  Collapse of the medial longitudinal arch of the foot noted on lateral view with medial deviation of the talar head in relationship to the foot.  No acute fractures identified.  Assessment: 1.  Chronically symptomatic severe flatfeet bilateral 2.  Insertional Achilles tendinitis right  Plan of Care:  Patient evaluated.  X-rays reviewed -Patient would benefit from custom molded orthotics to support the medial longitudinal arch of the foot and potentially alleviate some of the patient's flatfoot pain.  Today the patient was  molded for custom orthotics -Prescription for Medrol  Dosepak -Prescription for meloxicam  15 mg daily after completion of Dosepak -Advised against going barefoot.  Recommend good supportive tennis shoes and sneakers -Return to clinic orthotics pickup   Thresa EMERSON Sar, DPM [facility]  Dr. Thresa EMERSON Sar, DPM    [address] [place], [place] [address]                Office [phone]  Fax [phone]     "
